# Patient Record
Sex: Female | Born: 1980 | Race: Black or African American | Hispanic: No | Marital: Married | Smoking: Former smoker
Health system: Southern US, Community
[De-identification: ages and names within clinical notes are randomized; demographics above are authoritative.]

## PROBLEM LIST (undated history)

## (undated) DIAGNOSIS — B999 Unspecified infectious disease: Secondary | ICD-10-CM

## (undated) DIAGNOSIS — J45909 Unspecified asthma, uncomplicated: Secondary | ICD-10-CM

## (undated) DIAGNOSIS — D649 Anemia, unspecified: Secondary | ICD-10-CM

## (undated) HISTORY — DX: Anemia, unspecified: D64.9

## (undated) HISTORY — DX: Unspecified infectious disease: B99.9

## (undated) HISTORY — PX: TURBINATE RESECTION: SHX6158

---
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
## 2001-01-11 HISTORY — PX: WISDOM TOOTH EXTRACTION: SHX21

## 2002-11-01 ENCOUNTER — Inpatient Hospital Stay (HOSPITAL_COMMUNITY): Admission: AD | Admit: 2002-11-01 | Discharge: 2002-11-04 | Payer: Self-pay | Admitting: Obstetrics and Gynecology

## 2002-11-08 ENCOUNTER — Inpatient Hospital Stay (HOSPITAL_COMMUNITY): Admission: AD | Admit: 2002-11-08 | Discharge: 2002-11-09 | Payer: Self-pay | Admitting: Obstetrics and Gynecology

## 2002-12-21 ENCOUNTER — Other Ambulatory Visit: Admission: RE | Admit: 2002-12-21 | Discharge: 2002-12-21 | Payer: Self-pay | Admitting: Obstetrics and Gynecology

## 2004-01-01 ENCOUNTER — Other Ambulatory Visit: Admission: RE | Admit: 2004-01-01 | Discharge: 2004-01-01 | Payer: Self-pay | Admitting: Obstetrics and Gynecology

## 2005-02-06 ENCOUNTER — Emergency Department (HOSPITAL_COMMUNITY): Admission: EM | Admit: 2005-02-06 | Discharge: 2005-02-06 | Payer: Self-pay | Admitting: Emergency Medicine

## 2005-11-09 ENCOUNTER — Other Ambulatory Visit: Admission: RE | Admit: 2005-11-09 | Discharge: 2005-11-09 | Payer: Self-pay | Admitting: *Deleted

## 2006-09-24 ENCOUNTER — Inpatient Hospital Stay (HOSPITAL_COMMUNITY): Admission: AD | Admit: 2006-09-24 | Discharge: 2006-09-26 | Payer: Self-pay | Admitting: Obstetrics and Gynecology

## 2010-05-26 NOTE — Discharge Summary (Signed)
NAMEARRIANA, LOHMANN NO.:  [id number]   MEDICAL RECORD NO.:  [id number]          PATIENT TYPE:  INP   LOCATION:  9104                          FACILITY:  [facility]   PHYSICIAN:  Gerrit Friends. Aldona Bar, M.D.   DATE OF BIRTH:  1980/04/01   DATE OF ADMISSION:  09/24/2006  DATE OF DISCHARGE:  09/26/2006                               DISCHARGE SUMMARY   DISCHARGE DIAGNOSES:  1. Term pregnancy, delivered 7 pound female infant, Apgars 9 and 9.  2. Blood type A+.   PROCEDURE:  Normal spontaneous delivery.   SUMMARY:  This 30 year old gravida 2, para 1 was admitted at term in  labor.  She progressed and subsequently had a normal spontaneous  delivery of a viable 7 pound female infant with Apgars of 9 and 9 over  an intact perineum.  Her discharge hemoglobin was 11.6 with a white  count 8500, platelet count of 149,000.  On the morning of September 26, 2006, she was ambulating well, tolerating a regular diet well, having  normal bowel and bladder function and was afebrile.  Her breast-feeding  was going well and she was desirous of discharge.  Accordingly, she was  given all appropriate instructions per discharge brochure and understood  all instructions well.   DISCHARGE MEDICATIONS:  1. Vitamins - one a day.  2. Motrin 600 mg every 6 hours as needed for cramping or pain.   FOLLOW UP:  She will return the office follow-up in approximately four  weeks' time or as needed.   CONDITION ON DISCHARGE:  Improved.      Gerrit Friends. Aldona Bar, M.D.  Electronically Signed     RMW/MEDQ  D:  09/26/2006  T:  09/26/2006  Job:  [id number]

## 2010-10-23 LAB — CBC
HCT: 33.5 — ABNORMAL LOW
HCT: 36.2
Hemoglobin: 11.6 — ABNORMAL LOW
Hemoglobin: 12.6
MCHC: 34.7
MCHC: 34.9
MCV: 97
MCV: 97.2
Platelets: 149 — ABNORMAL LOW
Platelets: 153
RBC: 3.45 — ABNORMAL LOW
RBC: 3.73 — ABNORMAL LOW
RDW: 12.5
RDW: 12.8
WBC: 6.3
WBC: 8.5

## 2010-10-23 LAB — RPR: RPR Ser Ql: NONREACTIVE

## 2011-12-08 ENCOUNTER — Ambulatory Visit (INDEPENDENT_AMBULATORY_CARE_PROVIDER_SITE_OTHER): Payer: 59 | Admitting: Obstetrics and Gynecology

## 2011-12-08 DIAGNOSIS — Z331 Pregnant state, incidental: Secondary | ICD-10-CM

## 2011-12-08 LAB — POCT URINALYSIS DIPSTICK
Bilirubin, UA: NEGATIVE
Blood, UA: NEGATIVE
Glucose, UA: NEGATIVE
Ketones, UA: NEGATIVE
Leukocytes, UA: NEGATIVE
Nitrite, UA: NEGATIVE
Protein, UA: NEGATIVE
Spec Grav, UA: 1.015
Urobilinogen, UA: NEGATIVE
pH, UA: 6

## 2011-12-08 NOTE — Progress Notes (Signed)
NOB interview completed.  Pt is 6w 6d, states this am had some brownish/yellow d/c with wiping x 1.  Has not seen any more today.  Consulted w/ VL.  Pt advised to continue monitoring, if it happens again to call, especially over holiday weekend, pt voices agreement.  NOB work up scheduled for Wednesday 12/30/11 w/ MK.

## 2011-12-10 LAB — PRENATAL PROFILE I(LABCORP)
Antibody Screen: NEGATIVE
Basophils Absolute: 0 10*3/uL (ref 0.0–0.2)
Basos: 1 % (ref 0–3)
Eos: 1 % (ref 0–5)
Eosinophils Absolute: 0.1 10*3/uL (ref 0.0–0.4)
HCT: 34.1 % (ref 34.0–46.6)
Hemoglobin: 11 g/dL — ABNORMAL LOW (ref 11.1–15.9)
Hepatitis B Surface Ag: NEGATIVE
Immature Grans (Abs): 0 10*3/uL (ref 0.0–0.1)
Immature Granulocytes: 0 % (ref 0–2)
Lymphocytes Absolute: 1.8 10*3/uL (ref 0.7–3.1)
Lymphs: 31 % (ref 14–46)
MCH: 30.1 pg (ref 26.6–33.0)
MCHC: 32.3 g/dL (ref 31.5–35.7)
MCV: 93 fL (ref 79–97)
Monocytes Absolute: 0.5 10*3/uL (ref 0.1–0.9)
Monocytes: 8 % (ref 4–12)
Neutrophils Absolute: 3.5 10*3/uL (ref 1.4–7.0)
Neutrophils Relative %: 59 % (ref 40–74)
Platelets: 228 10*3/uL (ref 155–379)
RBC: 3.65 x10E6/uL — ABNORMAL LOW (ref 3.77–5.28)
RDW: 15.1 % (ref 12.3–15.4)
RPR: NONREACTIVE
Rh Factor: POSITIVE
Rubella IgG Quant: 20 IU/mL
WBC: 5.9 10*3/uL (ref 3.4–10.8)

## 2011-12-10 LAB — HEMOGLOBINOPATHY EVALUATION
Free Hemoglobin, Plasma: NEGATIVE
Hgb A2 Quant: 2.5 % (ref 0.7–3.1)
Hgb A: 96.7 % (ref 94.0–98.0)
Hgb C: 0 %
Hgb F Quant: 0.8 % (ref 0.0–2.0)
Hgb S: 0 %

## 2011-12-10 LAB — URINE CULTURE: Organism ID, Bacteria: NO GROWTH

## 2011-12-14 ENCOUNTER — Telehealth: Payer: Self-pay | Admitting: Obstetrics and Gynecology

## 2011-12-15 NOTE — Telephone Encounter (Signed)
Tc to pt regarding msg.  Pt was concerned about her iron level.  Informed was @ 11.0, normal range is 12-15.  Pt advised to take PNV Q day and make sure she is adding iron rich foods to her diet, pt voices agreement.

## 2011-12-26 ENCOUNTER — Encounter: Payer: Self-pay | Admitting: Obstetrics and Gynecology

## 2011-12-30 ENCOUNTER — Ambulatory Visit (INDEPENDENT_AMBULATORY_CARE_PROVIDER_SITE_OTHER): Payer: 59 | Admitting: Obstetrics and Gynecology

## 2011-12-30 VITALS — BP 98/58 | Wt 140.0 lb

## 2011-12-30 DIAGNOSIS — Z331 Pregnant state, incidental: Secondary | ICD-10-CM

## 2011-12-30 NOTE — Progress Notes (Signed)
[redacted]w[redacted]d No complaints. States she does have ha after she eats.

## 2011-12-30 NOTE — Progress Notes (Signed)
[redacted]w[redacted]d

## 2011-12-31 LAB — PAP IG, CT-NG, RFX HPV ASCU
Chlamydia Probe Amp: NEGATIVE
GC Probe Amp: NEGATIVE

## 2012-01-04 NOTE — Progress Notes (Signed)
Patient ID: Priscilla Fox, female   DOB: 1980/01/18, 31 y.o.   MRN: [id number] Late entry from 12/30/2011 Priscilla Fox is a 31 y.o. female presenting for new ob visit. 10w per certain LMP for [facility] 07/27/2012 No N,V  No birth control at time of conception  Taking PNV   OB History    Grav Para Term Preterm Abortions TAB SAB Ect Mult Living   3 2 2       2     hx of SVD 7# x 2 uncomplicated Past Medical History  Diagnosis Date  . Infection     Yeast;not frequent  . Infection     BV;not frequent  . Anemia     Iron supplements taken in the past   Past Surgical History  Procedure Date  . Wisdom tooth extraction 2003    All 4 removed   Family History: family history includes Diabetes in her paternal grandmother; Hypertension in her maternal grandmother; Hypothyroidism in her mother; and Stroke in her maternal grandmother. Social History:  reports that she quit smoking about 6 weeks ago. Her smoking use included Cigarettes. She has never used smokeless tobacco. She reports that she does not use illicit drugs. Her alcohol history not on file.   Blood pressure 98/58, weight 140 lb (63.504 kg), last menstrual period 10/21/2011. Physical exam: Calm, no distress, HEENT wnl lungs clear bilaterally, breasts bilaterally no masses, dimpling, or drainage, AP RRR, abd soft, gravid, nt, bowel sounds active, abdomen nontender,  Normal hair distrubition mons pubis,  Fundal height 12 weeks FHTS 160 EGBUS WNL, sterile speculum exam,  vagina pink, moist normal rugae,  cerix LTC, no cervical motion tenderness, No adnexal masses or tenderness Scant white discharge Bilaterally DTR +2 no clonus No edema to lower extremities  Prenatal labs: ABO, Rh: A/--/-- (11/27 1209) Antibody: Negative (11/27 1209) Rubella:   RPR:    HBsAg: Negative (11/27 1209)  HIV:     Assessment/Plan: 10w GC/CHL sent WET PREP neg PAP sent ULTRASOUND f/o 1st trimester screen in next 7-10 days Collaboration with  Dr.Stringer. [facility], Parris Cudworth 01/04/2012, 6:27 PM Lavera Guise, CNM

## 2012-01-10 ENCOUNTER — Other Ambulatory Visit: Payer: Self-pay | Admitting: Obstetrics and Gynecology

## 2012-01-10 ENCOUNTER — Ambulatory Visit (INDEPENDENT_AMBULATORY_CARE_PROVIDER_SITE_OTHER): Payer: 59

## 2012-01-10 DIAGNOSIS — Z331 Pregnant state, incidental: Secondary | ICD-10-CM

## 2012-01-10 DIAGNOSIS — Z36 Encounter for antenatal screening of mother: Secondary | ICD-10-CM

## 2012-01-26 ENCOUNTER — Telehealth: Payer: Self-pay | Admitting: Obstetrics and Gynecology

## 2012-01-26 ENCOUNTER — Other Ambulatory Visit: Payer: Self-pay

## 2012-01-26 DIAGNOSIS — O2340 Unspecified infection of urinary tract in pregnancy, unspecified trimester: Secondary | ICD-10-CM

## 2012-01-26 NOTE — Telephone Encounter (Signed)
Tc to pt regarding msg.  Pt states has been having a lot of urinary frequency and some lower abdominal pain before she has to use the bathroom and after she uses the bathroom.  She may have to go to the bathroom about 3-4 times in an hour whether she is drinking a lot or not.  Pt has an appt on tomorrow am @ 1030, pt denies any bldg.  Pt advised to push fluids, drink cranberry juice and make mention of sxs tomorrow @ appt so they may possibly need to do a urine culture.

## 2012-01-26 NOTE — Telephone Encounter (Signed)
Per pt request future. order placed for Urine Culture to be sent to [facility].

## 2012-01-27 ENCOUNTER — Encounter: Payer: Self-pay | Admitting: Obstetrics and Gynecology

## 2012-01-27 ENCOUNTER — Ambulatory Visit: Payer: 59 | Admitting: Obstetrics and Gynecology

## 2012-01-27 VITALS — BP 98/52 | Ht 68.0 in | Wt 141.0 lb

## 2012-01-27 DIAGNOSIS — R399 Unspecified symptoms and signs involving the genitourinary system: Secondary | ICD-10-CM

## 2012-01-27 DIAGNOSIS — Z3480 Encounter for supervision of other normal pregnancy, unspecified trimester: Secondary | ICD-10-CM

## 2012-01-27 LAB — POCT URINALYSIS DIPSTICK
Bilirubin, UA: NEGATIVE
Blood, UA: NEGATIVE
Glucose, UA: NEGATIVE
Ketones, UA: NEGATIVE
Protein, UA: NEGATIVE
Spec Grav, UA: <=1.005
Urobilinogen, UA: NEGATIVE
pH, UA: 7

## 2012-01-27 LAB — US OB COMP LESS 14 WKS

## 2012-01-27 MED ORDER — CEPHALEXIN 500 MG PO CAPS: 500.0000 mg | ORAL_CAPSULE | Freq: Two times a day (BID) | ORAL | Status: DC

## 2012-01-27 NOTE — Progress Notes (Signed)
Pt stated she is lower pelvic pain/ UTI ?Marland Kitchen Advised pt to increase 8-10 glasses of water a day. Pt stated no other issues today. Pt wants labs sent to [facility].

## 2012-01-27 NOTE — Progress Notes (Signed)
[redacted]w[redacted]d 1st tri screen neg AFP b/n 16-18wks U/s for anatomy in 4wks Urine Cx - pt would like empiric tx - Rx keflex

## 2012-01-27 NOTE — Addendum Note (Signed)
Addended by: Marla Roe A on: 01/27/2012 03:14 PM   Modules accepted: Orders

## 2012-01-28 LAB — ALPHA FETOPROTEIN, MATERNAL
AFP: 28.3 IU/mL
Curr Gest Age: 15.2 wks.days
MoM for AFP: 0.9
Open Spina bifida: NEGATIVE
Osb Risk: 1:37700 {titer}

## 2012-01-29 LAB — CULTURE, OB URINE
Colony Count: NO GROWTH
Organism ID, Bacteria: NO GROWTH

## 2012-02-04 ENCOUNTER — Other Ambulatory Visit: Payer: 59

## 2012-02-04 DIAGNOSIS — Z3480 Encounter for supervision of other normal pregnancy, unspecified trimester: Secondary | ICD-10-CM

## 2012-02-04 DIAGNOSIS — Z331 Pregnant state, incidental: Secondary | ICD-10-CM

## 2012-02-08 LAB — AFP TUMOR MARKER: AFP-Tumor Marker: 42.9 ng/mL — ABNORMAL HIGH (ref 0.0–8.3)

## 2012-02-10 LAB — SPECIMEN STATUS REPORT

## 2012-02-17 ENCOUNTER — Other Ambulatory Visit: Payer: Self-pay | Admitting: Obstetrics and Gynecology

## 2012-02-17 LAB — MATERNAL SCREEN 4
DIA Mom Value: 0.66
DIA Value (EIA): 131.34 pg/mL
DSR (By Age)    1 IN: 493
DSR (Second Trimester) 1 IN: 2738
Gestational Age: 16.4 WEEKS
MSAFP Mom: 0.89
MSAFP: 34.9 ng/mL
MSHCG Mom: 1.08
MSHCG: 33110 m[IU]/mL
Maternal Age At EDD: 32.4 YEARS
Osb Risk: 10000
T18 (By Age): 1:1921 {titer}
Test Results:: NEGATIVE
Weight: 144 [lb_av]
uE3 Mom: 0.76
uE3 Value: 0.68 ng/mL

## 2012-02-17 LAB — SPECIMEN STATUS REPORT

## 2012-02-24 ENCOUNTER — Encounter: Payer: 59 | Admitting: Obstetrics and Gynecology

## 2012-02-24 ENCOUNTER — Ambulatory Visit: Payer: 59

## 2012-02-28 ENCOUNTER — Other Ambulatory Visit: Payer: Self-pay

## 2012-02-28 DIAGNOSIS — Z36 Encounter for antenatal screening of mother: Secondary | ICD-10-CM

## 2012-02-29 ENCOUNTER — Encounter: Payer: 59 | Admitting: Obstetrics and Gynecology

## 2012-02-29 ENCOUNTER — Other Ambulatory Visit: Payer: 59

## 2012-10-12 ENCOUNTER — Encounter (HOSPITAL_COMMUNITY): Payer: Self-pay | Admitting: *Deleted

## 2013-11-12 ENCOUNTER — Encounter (HOSPITAL_COMMUNITY): Payer: Self-pay | Admitting: *Deleted

## 2017-03-14 DIAGNOSIS — J339 Nasal polyp, unspecified: Secondary | ICD-10-CM | POA: Diagnosis not present

## 2017-04-14 DIAGNOSIS — J331 Polypoid sinus degeneration: Secondary | ICD-10-CM | POA: Diagnosis not present

## 2017-04-14 DIAGNOSIS — Z87891 Personal history of nicotine dependence: Secondary | ICD-10-CM | POA: Diagnosis not present

## 2017-04-14 DIAGNOSIS — K219 Gastro-esophageal reflux disease without esophagitis: Secondary | ICD-10-CM | POA: Diagnosis not present

## 2017-04-14 DIAGNOSIS — Z7289 Other problems related to lifestyle: Secondary | ICD-10-CM | POA: Diagnosis not present

## 2017-07-13 DIAGNOSIS — J3089 Other allergic rhinitis: Secondary | ICD-10-CM | POA: Diagnosis not present

## 2017-07-13 DIAGNOSIS — Z87891 Personal history of nicotine dependence: Secondary | ICD-10-CM | POA: Diagnosis not present

## 2017-07-13 DIAGNOSIS — Z7289 Other problems related to lifestyle: Secondary | ICD-10-CM | POA: Diagnosis not present

## 2017-07-13 DIAGNOSIS — J331 Polypoid sinus degeneration: Secondary | ICD-10-CM | POA: Diagnosis not present

## 2017-10-21 DIAGNOSIS — Z87891 Personal history of nicotine dependence: Secondary | ICD-10-CM | POA: Diagnosis not present

## 2017-10-21 DIAGNOSIS — J3489 Other specified disorders of nose and nasal sinuses: Secondary | ICD-10-CM | POA: Diagnosis not present

## 2017-10-21 DIAGNOSIS — Z7289 Other problems related to lifestyle: Secondary | ICD-10-CM | POA: Diagnosis not present

## 2017-11-10 DIAGNOSIS — J4520 Mild intermittent asthma, uncomplicated: Secondary | ICD-10-CM | POA: Diagnosis not present

## 2017-11-10 DIAGNOSIS — R05 Cough: Secondary | ICD-10-CM | POA: Diagnosis not present

## 2018-06-14 DIAGNOSIS — J343 Hypertrophy of nasal turbinates: Secondary | ICD-10-CM | POA: Diagnosis not present

## 2018-06-14 DIAGNOSIS — R430 Anosmia: Secondary | ICD-10-CM | POA: Diagnosis not present

## 2018-06-14 DIAGNOSIS — J330 Polyp of nasal cavity: Secondary | ICD-10-CM | POA: Diagnosis not present

## 2018-07-12 DIAGNOSIS — J338 Other polyp of sinus: Secondary | ICD-10-CM | POA: Diagnosis not present

## 2018-07-12 DIAGNOSIS — J343 Hypertrophy of nasal turbinates: Secondary | ICD-10-CM | POA: Diagnosis not present

## 2018-07-12 DIAGNOSIS — J310 Chronic rhinitis: Secondary | ICD-10-CM | POA: Diagnosis not present

## 2018-07-28 ENCOUNTER — Other Ambulatory Visit: Payer: Self-pay | Admitting: Otolaryngology

## 2018-07-28 DIAGNOSIS — J329 Chronic sinusitis, unspecified: Secondary | ICD-10-CM

## 2018-08-03 ENCOUNTER — Ambulatory Visit
Admission: RE | Admit: 2018-08-03 | Discharge: 2018-08-03 | Disposition: A | Discharge status: Home / Self Care | Payer: Self-pay | Source: Ambulatory Visit | Attending: Otolaryngology | Admitting: Otolaryngology

## 2018-08-03 DIAGNOSIS — J320 Chronic maxillary sinusitis: Secondary | ICD-10-CM | POA: Diagnosis not present

## 2018-08-03 DIAGNOSIS — J329 Chronic sinusitis, unspecified: Secondary | ICD-10-CM

## 2018-08-16 DIAGNOSIS — J320 Chronic maxillary sinusitis: Secondary | ICD-10-CM | POA: Diagnosis not present

## 2018-08-16 DIAGNOSIS — J338 Other polyp of sinus: Secondary | ICD-10-CM | POA: Diagnosis not present

## 2018-08-16 DIAGNOSIS — J322 Chronic ethmoidal sinusitis: Secondary | ICD-10-CM | POA: Diagnosis not present

## 2018-08-16 DIAGNOSIS — J321 Chronic frontal sinusitis: Secondary | ICD-10-CM | POA: Diagnosis not present

## 2018-09-04 ENCOUNTER — Other Ambulatory Visit: Payer: Self-pay | Admitting: Otolaryngology

## 2018-09-11 DIAGNOSIS — J300 Vasomotor rhinitis: Secondary | ICD-10-CM | POA: Diagnosis not present

## 2018-09-11 DIAGNOSIS — J309 Allergic rhinitis, unspecified: Secondary | ICD-10-CM | POA: Diagnosis not present

## 2018-09-11 DIAGNOSIS — R05 Cough: Secondary | ICD-10-CM | POA: Diagnosis not present

## 2018-09-11 DIAGNOSIS — J339 Nasal polyp, unspecified: Secondary | ICD-10-CM | POA: Diagnosis not present

## 2018-09-29 ENCOUNTER — Encounter (HOSPITAL_BASED_OUTPATIENT_CLINIC_OR_DEPARTMENT_OTHER): Payer: Self-pay | Admitting: *Deleted

## 2018-09-29 ENCOUNTER — Other Ambulatory Visit: Payer: Self-pay

## 2018-10-03 ENCOUNTER — Other Ambulatory Visit (HOSPITAL_COMMUNITY)
Admission: RE | Admit: 2018-10-03 | Discharge: 2018-10-03 | Disposition: A | Discharge status: Home / Self Care | Payer: BC Managed Care – PPO | Source: Ambulatory Visit | Attending: Otolaryngology | Admitting: Otolaryngology

## 2018-10-03 DIAGNOSIS — Z20828 Contact with and (suspected) exposure to other viral communicable diseases: Secondary | ICD-10-CM | POA: Insufficient documentation

## 2018-10-03 DIAGNOSIS — Z01812 Encounter for preprocedural laboratory examination: Secondary | ICD-10-CM | POA: Diagnosis not present

## 2018-10-04 LAB — NOVEL CORONAVIRUS, NAA (HOSP ORDER, SEND-OUT TO REF LAB; TAT 18-24 HRS): SARS-CoV-2, NAA: NOT DETECTED

## 2018-10-06 ENCOUNTER — Ambulatory Visit (HOSPITAL_BASED_OUTPATIENT_CLINIC_OR_DEPARTMENT_OTHER): Payer: BC Managed Care – PPO | Admitting: Anesthesiology

## 2018-10-06 ENCOUNTER — Ambulatory Visit (HOSPITAL_BASED_OUTPATIENT_CLINIC_OR_DEPARTMENT_OTHER)
Admission: RE | Admit: 2018-10-06 | Discharge: 2018-10-06 | Disposition: A | Discharge status: Home / Self Care | Payer: BC Managed Care – PPO | Attending: Otolaryngology | Admitting: Otolaryngology

## 2018-10-06 ENCOUNTER — Encounter (HOSPITAL_BASED_OUTPATIENT_CLINIC_OR_DEPARTMENT_OTHER): Payer: Self-pay | Admitting: *Deleted

## 2018-10-06 ENCOUNTER — Encounter (HOSPITAL_BASED_OUTPATIENT_CLINIC_OR_DEPARTMENT_OTHER)
Admission: RE | Disposition: A | Discharge status: Home / Self Care | Payer: Self-pay | Source: Home / Self Care | Attending: Otolaryngology

## 2018-10-06 ENCOUNTER — Other Ambulatory Visit: Payer: Self-pay

## 2018-10-06 DIAGNOSIS — J343 Hypertrophy of nasal turbinates: Secondary | ICD-10-CM | POA: Diagnosis not present

## 2018-10-06 DIAGNOSIS — J3489 Other specified disorders of nose and nasal sinuses: Secondary | ICD-10-CM | POA: Insufficient documentation

## 2018-10-06 DIAGNOSIS — Z87891 Personal history of nicotine dependence: Secondary | ICD-10-CM | POA: Insufficient documentation

## 2018-10-06 DIAGNOSIS — J45909 Unspecified asthma, uncomplicated: Secondary | ICD-10-CM | POA: Diagnosis not present

## 2018-10-06 DIAGNOSIS — J330 Polyp of nasal cavity: Secondary | ICD-10-CM | POA: Diagnosis not present

## 2018-10-06 DIAGNOSIS — J320 Chronic maxillary sinusitis: Secondary | ICD-10-CM | POA: Diagnosis not present

## 2018-10-06 DIAGNOSIS — J323 Chronic sphenoidal sinusitis: Secondary | ICD-10-CM | POA: Diagnosis not present

## 2018-10-06 DIAGNOSIS — J329 Chronic sinusitis, unspecified: Secondary | ICD-10-CM | POA: Diagnosis not present

## 2018-10-06 DIAGNOSIS — J338 Other polyp of sinus: Secondary | ICD-10-CM | POA: Insufficient documentation

## 2018-10-06 DIAGNOSIS — J322 Chronic ethmoidal sinusitis: Secondary | ICD-10-CM | POA: Diagnosis not present

## 2018-10-06 DIAGNOSIS — J321 Chronic frontal sinusitis: Secondary | ICD-10-CM | POA: Diagnosis not present

## 2018-10-06 DIAGNOSIS — J324 Chronic pansinusitis: Secondary | ICD-10-CM | POA: Insufficient documentation

## 2018-10-06 HISTORY — PX: MAXILLARY ANTROSTOMY: SHX2003

## 2018-10-06 HISTORY — PX: FRONTAL SINUS EXPLORATION: SHX6591

## 2018-10-06 HISTORY — PX: SPHENOIDECTOMY: SHX2421

## 2018-10-06 HISTORY — PX: SINUS ENDO WITH FUSION: SHX5329

## 2018-10-06 HISTORY — DX: Unspecified asthma, uncomplicated: J45.909

## 2018-10-06 HISTORY — PX: ETHMOIDECTOMY: SHX5197

## 2018-10-06 HISTORY — PX: TURBINATE REDUCTION: SHX6157

## 2018-10-06 LAB — POCT PREGNANCY, URINE: Preg Test, Ur: NEGATIVE

## 2018-10-06 SURGERY — REDUCTION, NASAL TURBINATE
Anesthesia: General | Site: Nose | Laterality: Bilateral

## 2018-10-06 MED ORDER — OXYMETAZOLINE HCL 0.05 % NA SOLN
NASAL | Status: DC | PRN
  Administered 2018-10-06: 1 via TOPICAL

## 2018-10-06 MED ORDER — LIDOCAINE 2% (20 MG/ML) 5 ML SYRINGE
INTRAMUSCULAR | Status: AC
  Filled 2018-10-06: qty 5

## 2018-10-06 MED ORDER — FENTANYL CITRATE (PF) 100 MCG/2ML IJ SOLN
INTRAMUSCULAR | Status: AC
  Filled 2018-10-06: qty 2

## 2018-10-06 MED ORDER — PROMETHAZINE HCL 25 MG/ML IJ SOLN
6.2500 mg | INTRAMUSCULAR | Status: DC | PRN
  Administered 2018-10-06: 6.25 mg via INTRAVENOUS

## 2018-10-06 MED ORDER — KETOROLAC TROMETHAMINE 30 MG/ML IJ SOLN: 30.0000 mg | Freq: Once | INTRAMUSCULAR | Status: DC | PRN

## 2018-10-06 MED ORDER — OXYMETAZOLINE HCL 0.05 % NA SOLN
NASAL | Status: AC
  Filled 2018-10-06: qty 30

## 2018-10-06 MED ORDER — ACETAMINOPHEN 500 MG PO TABS
ORAL_TABLET | ORAL | Status: AC
  Filled 2018-10-06: qty 2

## 2018-10-06 MED ORDER — BUPIVACAINE HCL (PF) 0.25 % IJ SOLN
INTRAMUSCULAR | Status: AC
  Filled 2018-10-06: qty 30

## 2018-10-06 MED ORDER — PROMETHAZINE HCL 25 MG/ML IJ SOLN
INTRAMUSCULAR | Status: AC
  Filled 2018-10-06: qty 1

## 2018-10-06 MED ORDER — OXYCODONE HCL 5 MG PO TABS: 5.0000 mg | ORAL_TABLET | Freq: Once | ORAL | Status: DC | PRN

## 2018-10-06 MED ORDER — OXYCODONE-ACETAMINOPHEN 5-325 MG PO TABS: 1.0000 | ORAL_TABLET | ORAL | 0 refills | Status: AC | PRN

## 2018-10-06 MED ORDER — CEFAZOLIN SODIUM 1 G IJ SOLR
INTRAMUSCULAR | Status: AC
  Filled 2018-10-06: qty 20

## 2018-10-06 MED ORDER — ONDANSETRON HCL 4 MG/2ML IJ SOLN
INTRAMUSCULAR | Status: AC
  Filled 2018-10-06: qty 2

## 2018-10-06 MED ORDER — MUPIROCIN 2 % EX OINT
TOPICAL_OINTMENT | CUTANEOUS | Status: AC
  Filled 2018-10-06: qty 22

## 2018-10-06 MED ORDER — ROCURONIUM BROMIDE 10 MG/ML (PF) SYRINGE
PREFILLED_SYRINGE | INTRAVENOUS | Status: DC | PRN
  Administered 2018-10-06: 50 mg via INTRAVENOUS

## 2018-10-06 MED ORDER — MUPIROCIN 2 % EX OINT
TOPICAL_OINTMENT | CUTANEOUS | Status: DC | PRN
  Administered 2018-10-06: 1 via NASAL

## 2018-10-06 MED ORDER — BACITRACIN ZINC 500 UNIT/GM EX OINT
TOPICAL_OINTMENT | CUTANEOUS | Status: AC
  Filled 2018-10-06: qty 0.9

## 2018-10-06 MED ORDER — ACETAMINOPHEN 500 MG PO TABS
1000.0000 mg | ORAL_TABLET | Freq: Once | ORAL | Status: AC
  Administered 2018-10-06: 08:00:00 1000 mg via ORAL

## 2018-10-06 MED ORDER — FENTANYL CITRATE (PF) 100 MCG/2ML IJ SOLN: 50.0000 ug | INTRAMUSCULAR | Status: DC | PRN

## 2018-10-06 MED ORDER — SCOPOLAMINE 1 MG/3DAYS TD PT72
MEDICATED_PATCH | TRANSDERMAL | Status: AC
  Filled 2018-10-06: qty 1

## 2018-10-06 MED ORDER — AMOXICILLIN 875 MG PO TABS: 875.0000 mg | ORAL_TABLET | Freq: Two times a day (BID) | ORAL | 0 refills | Status: AC

## 2018-10-06 MED ORDER — MEPERIDINE HCL 25 MG/ML IJ SOLN: 6.2500 mg | INTRAMUSCULAR | Status: DC | PRN

## 2018-10-06 MED ORDER — ONDANSETRON HCL 4 MG/2ML IJ SOLN
INTRAMUSCULAR | Status: DC | PRN
  Administered 2018-10-06: 4 mg via INTRAVENOUS

## 2018-10-06 MED ORDER — OXYCODONE HCL 5 MG/5ML PO SOLN: 5.0000 mg | Freq: Once | ORAL | Status: DC | PRN

## 2018-10-06 MED ORDER — DEXAMETHASONE SODIUM PHOSPHATE 10 MG/ML IJ SOLN
INTRAMUSCULAR | Status: DC | PRN
  Administered 2018-10-06: 10 mg via INTRAVENOUS
  Administered 2018-10-06: 5 mg via INTRAVENOUS

## 2018-10-06 MED ORDER — ROCURONIUM BROMIDE 10 MG/ML (PF) SYRINGE
PREFILLED_SYRINGE | INTRAVENOUS | Status: AC
  Filled 2018-10-06: qty 10

## 2018-10-06 MED ORDER — LIDOCAINE 2% (20 MG/ML) 5 ML SYRINGE
INTRAMUSCULAR | Status: DC | PRN
  Administered 2018-10-06: 80 mg via INTRAVENOUS

## 2018-10-06 MED ORDER — MIDAZOLAM HCL 2 MG/2ML IJ SOLN: 1.0000 mg | INTRAMUSCULAR | Status: DC | PRN

## 2018-10-06 MED ORDER — MIDAZOLAM HCL 2 MG/2ML IJ SOLN
INTRAMUSCULAR | Status: AC
  Filled 2018-10-06: qty 2

## 2018-10-06 MED ORDER — LIDOCAINE-EPINEPHRINE 1 %-1:100000 IJ SOLN
INTRAMUSCULAR | Status: AC
  Filled 2018-10-06: qty 1

## 2018-10-06 MED ORDER — FENTANYL CITRATE (PF) 100 MCG/2ML IJ SOLN
25.0000 ug | INTRAMUSCULAR | Status: DC | PRN
  Administered 2018-10-06 (×2): 25 ug via INTRAVENOUS

## 2018-10-06 MED ORDER — SUGAMMADEX SODIUM 200 MG/2ML IV SOLN
INTRAVENOUS | Status: DC | PRN
  Administered 2018-10-06: 160 mg via INTRAVENOUS

## 2018-10-06 MED ORDER — PROPOFOL 10 MG/ML IV BOLUS
INTRAVENOUS | Status: DC | PRN
  Administered 2018-10-06: 200 mg via INTRAVENOUS

## 2018-10-06 MED ORDER — SCOPOLAMINE 1 MG/3DAYS TD PT72
1.0000 | MEDICATED_PATCH | TRANSDERMAL | Status: DC
  Administered 2018-10-06: 08:00:00 1.5 mg via TRANSDERMAL

## 2018-10-06 MED ORDER — CEFAZOLIN SODIUM-DEXTROSE 2-3 GM-%(50ML) IV SOLR
INTRAVENOUS | Status: DC | PRN
  Administered 2018-10-06: 2 g via INTRAVENOUS

## 2018-10-06 MED ORDER — MIDAZOLAM HCL 2 MG/2ML IJ SOLN
INTRAMUSCULAR | Status: DC | PRN
  Administered 2018-10-06: 2 mg via INTRAVENOUS

## 2018-10-06 MED ORDER — LACTATED RINGERS IV SOLN
INTRAVENOUS | Status: DC
  Administered 2018-10-06 (×3): via INTRAVENOUS

## 2018-10-06 MED ORDER — FENTANYL CITRATE (PF) 100 MCG/2ML IJ SOLN
INTRAMUSCULAR | Status: DC | PRN
  Administered 2018-10-06 (×3): 100 ug via INTRAVENOUS

## 2018-10-06 MED ORDER — DEXAMETHASONE SODIUM PHOSPHATE 10 MG/ML IJ SOLN
INTRAMUSCULAR | Status: AC
  Filled 2018-10-06: qty 1

## 2018-10-06 SURGICAL SUPPLY — 59 items
ATTRACTOMAT 16X20 MAGNETIC DRP (DRAPES) IMPLANT
BLADE RAD40 ROTATE 4M 4 5PK (BLADE) IMPLANT
BLADE RAD40 ROTATE 4M 4MM 5PK (BLADE)
BLADE RAD60 ROTATE M4 4 5PK (BLADE) IMPLANT
BLADE RAD60 ROTATE M4 4MM 5PK (BLADE)
BLADE ROTATE RAD 12 4 M4 (BLADE) IMPLANT
BLADE ROTATE RAD 12 4MM M4 (BLADE)
BLADE ROTATE RAD 40 4 M4 (BLADE) IMPLANT
BLADE ROTATE RAD 40 4MM M4 (BLADE)
BLADE ROTATE TRICUT 4MX13CM M4 (BLADE) ×1
BLADE ROTATE TRICUT 4X13 M4 (BLADE) ×2 IMPLANT
BLADE TRICUT ROTATE M4 4 5PK (BLADE) IMPLANT
BLADE TRICUT ROTATE M4 4MM 5PK (BLADE)
BUR HS RAD FRONTAL 3 (BURR) IMPLANT
BUR HS RAD FRONTAL 3MM (BURR)
CANISTER SUC SOCK COL 7IN (MISCELLANEOUS) ×3 IMPLANT
CANISTER SUCT 1200ML W/VALVE (MISCELLANEOUS) ×3 IMPLANT
COAGULATOR SUCT 8FR VV (MISCELLANEOUS) ×3 IMPLANT
COVER WAND RF STERILE (DRAPES) IMPLANT
DECANTER SPIKE VIAL GLASS SM (MISCELLANEOUS) IMPLANT
DRSG NASAL KENNEDY LMNT 8CM (GAUZE/BANDAGES/DRESSINGS) IMPLANT
DRSG NASOPORE 8CM (GAUZE/BANDAGES/DRESSINGS) IMPLANT
DRSG TELFA 3X8 NADH (GAUZE/BANDAGES/DRESSINGS) IMPLANT
ELECT REM PT RETURN 9FT ADLT (ELECTROSURGICAL) ×3
ELECTRODE REM PT RTRN 9FT ADLT (ELECTROSURGICAL) ×1 IMPLANT
GLOVE BIO SURGEON STRL SZ7.5 (GLOVE) ×3 IMPLANT
GLOVE BIOGEL PI IND STRL 6.5 (GLOVE) IMPLANT
GLOVE BIOGEL PI INDICATOR 6.5 (GLOVE) ×2
GLOVE ECLIPSE 6.5 STRL STRAW (GLOVE) ×4 IMPLANT
GOWN STRL REUS W/ TWL LRG LVL3 (GOWN DISPOSABLE) ×2 IMPLANT
GOWN STRL REUS W/TWL LRG LVL3 (GOWN DISPOSABLE) ×2
HEMOSTAT SURGICEL 2X14 (HEMOSTASIS) IMPLANT
IV NS 500ML (IV SOLUTION) ×2
IV NS 500ML BAXH (IV SOLUTION) ×1 IMPLANT
NDL HYPO 25X1 1.5 SAFETY (NEEDLE) ×1 IMPLANT
NDL SPNL 25GX3.5 QUINCKE BL (NEEDLE) IMPLANT
NEEDLE HYPO 25X1 1.5 SAFETY (NEEDLE) ×3 IMPLANT
NEEDLE SPNL 25GX3.5 QUINCKE BL (NEEDLE) IMPLANT
NS IRRIG 1000ML POUR BTL (IV SOLUTION) ×3 IMPLANT
PACK BASIN DAY SURGERY FS (CUSTOM PROCEDURE TRAY) ×3 IMPLANT
PACK ENT DAY SURGERY (CUSTOM PROCEDURE TRAY) ×3 IMPLANT
PAD DRESSING TELFA 3X8 NADH (GAUZE/BANDAGES/DRESSINGS) IMPLANT
SLEEVE SCD COMPRESS KNEE MED (MISCELLANEOUS) ×3 IMPLANT
SOLUTION BUTLER CLEAR DIP (MISCELLANEOUS) ×3 IMPLANT
SPLINT NASAL AIRWAY SILICONE (MISCELLANEOUS) IMPLANT
SPONGE GAUZE 2X2 8PLY STER LF (GAUZE/BANDAGES/DRESSINGS) ×1
SPONGE GAUZE 2X2 8PLY STRL LF (GAUZE/BANDAGES/DRESSINGS) ×2 IMPLANT
SPONGE NEURO XRAY DETECT 1X3 (DISPOSABLE) ×3 IMPLANT
SUCTION FRAZIER HANDLE 10FR (MISCELLANEOUS)
SUCTION TUBE FRAZIER 10FR DISP (MISCELLANEOUS) IMPLANT
SYR 50ML LL SCALE MARK (SYRINGE) ×3 IMPLANT
TOWEL GREEN STERILE FF (TOWEL DISPOSABLE) ×3 IMPLANT
TRACKER ENT INSTRUMENT (MISCELLANEOUS) ×3 IMPLANT
TRACKER ENT PATIENT (MISCELLANEOUS) ×3 IMPLANT
TUBE CONNECTING 20'X1/4 (TUBING) ×1
TUBE CONNECTING 20X1/4 (TUBING) ×2 IMPLANT
TUBE SALEM SUMP 16 FR W/ARV (TUBING) ×2 IMPLANT
TUBING STRAIGHTSHOT EPS 5PK (TUBING) ×3 IMPLANT
YANKAUER SUCT BULB TIP NO VENT (SUCTIONS) ×3 IMPLANT

## 2018-10-06 NOTE — Anesthesia Procedure Notes (Signed)
Procedure Name: Intubation Date/Time: 10/06/2018 9:05 AM Performed by: Raenette Rover, CRNA Pre-anesthesia Checklist: Patient identified, Emergency Drugs available, Suction available and Patient being monitored Patient Re-evaluated:Patient Re-evaluated prior to induction Oxygen Delivery Method: Circle system utilized Preoxygenation: Pre-oxygenation with 100% oxygen Induction Type: IV induction Ventilation: Mask ventilation without difficulty Laryngoscope Size: Mac and 3 Grade View: Grade I Tube type: Oral Tube size: 7.0 mm Number of attempts: 1 Airway Equipment and Method: Stylet Placement Confirmation: ETT inserted through vocal cords under direct vision,  positive ETCO2 and breath sounds checked- equal and bilateral Secured at: 22 cm Tube secured with: Tape Dental Injury: Teeth and Oropharynx as per pre-operative assessment

## 2018-10-06 NOTE — Transfer of Care (Signed)
Immediate Anesthesia Transfer of Care Note  Patient: Priscilla Fox  Procedure(s) Performed: PARTIAL INFERIOR TURBINATE REDUCTION (Bilateral Nose) ENDOSCOPIC MAXILLARY ANTROSTOMY WITH TISSUE REMOVAL (Bilateral Nose) ENDOSCOPIC FRONTAL RECESS EXPLORATION (Bilateral Nose) TOTAL ETHMOIDECTOMY WITH TISSUE REMOVAL (Bilateral Nose) TOTAL SPHENOIDECTOMY WITH TISSUE REMOVAL (Bilateral Nose) SINUS ENDO WITH FUSION (Bilateral Nose)  Patient Location: PACU  Anesthesia Type:General  Level of Consciousness: awake, alert , oriented and patient cooperative  Airway & Oxygen Therapy: Patient Spontanous Breathing  Post-op Assessment: Report given to RN and Post -op Vital signs reviewed and stable  Post vital signs: Reviewed and stable  Last Vitals:  Vitals Value Taken Time  BP 126/80 10/06/18 1100  Temp    Pulse 101 10/06/18 1103  Resp 12 10/06/18 1103  SpO2 97 % 10/06/18 1103  Vitals shown include unvalidated device data.  Last Pain:  Vitals:   10/06/18 0753  TempSrc: Oral      Patients Stated Pain Goal: 3 (54/27/06 2376)  Complications: No apparent anesthesia complications

## 2018-10-06 NOTE — Anesthesia Postprocedure Evaluation (Signed)
Anesthesia Post Note  Patient: Priscilla Fox  Procedure(s) Performed: PARTIAL INFERIOR TURBINATE REDUCTION (Bilateral Nose) ENDOSCOPIC MAXILLARY ANTROSTOMY WITH TISSUE REMOVAL (Bilateral Nose) ENDOSCOPIC FRONTAL RECESS EXPLORATION (Bilateral Nose) TOTAL ETHMOIDECTOMY WITH TISSUE REMOVAL (Bilateral Nose) TOTAL SPHENOIDECTOMY WITH TISSUE REMOVAL (Bilateral Nose) SINUS ENDO WITH FUSION (Bilateral Nose)     Patient location during evaluation: PACU Anesthesia Type: General Level of consciousness: awake and alert Pain management: pain level controlled Vital Signs Assessment: post-procedure vital signs reviewed and stable Respiratory status: spontaneous breathing, nonlabored ventilation, respiratory function stable and patient connected to nasal cannula oxygen Cardiovascular status: blood pressure returned to baseline and stable Postop Assessment: no apparent nausea or vomiting Anesthetic complications: no    Last Vitals:  Vitals:   10/06/18 1100 10/06/18 1115  BP: 126/80 130/80  Pulse: (!) 102 93  Resp: 11 10  Temp: 36.8 C   SpO2: 97% 97%    Last Pain:  Vitals:   10/06/18 1115  TempSrc:   PainSc: [place]

## 2018-10-06 NOTE — Addendum Note (Signed)
Addendum  created 10/06/18 1146 by Pervis Hocking, DO   Order list changed

## 2018-10-06 NOTE — Op Note (Signed)
DATE OF PROCEDURE: 10/06/2018  OPERATIVE REPORT   SURGEON: Leta Baptist, MD   PREOPERATIVE DIAGNOSES:  1. Bilateral chronic pansinusitis and polyposis. 2. Bilateral inferior turbinate hypertrophy.  3. Chronic nasal obstruction.  POSTOPERATIVE DIAGNOSES:  1. Bilateral chronic pansinusitis and polyposis. 2. Bilateral inferior turbinate hypertrophy.  3. Chronic nasal obstruction.  PROCEDURE PERFORMED:  1. Bilateral endoscopic frontal sinusotomy with polyp removal. 2. Bilateral endoscopic total ethmoidectomy and sphenoidotomy with polyp removal. 3. Bilateral endoscopic maxillary antrostomy with polyp removal. 4. Bilateral partial inferior turbinate resection. 5. FUSION stereotactic image guidance.  ANESTHESIA: General endotracheal tube anesthesia.   COMPLICATIONS: None.   ESTIMATED BLOOD LOSS: 500 mL.   INDICATION FOR PROCEDURE: Priscilla Fox is a 38 y.o. female with a history of chronic nasal obstruction and sinonasal polyps.  At her last appointment, she was noted to have chronic rhinosinusitis, nasal mucosal congestion, and bilateral severe inferior turbinate hypertrophy.  She was also noted to have polypoid tissue obstructing her sinonasal cavities.  The patient previously underwent endoscopic sinus surgery by Dr. Laurance Flatten in 2018.  The patient has been using Flonase nasal spray. She was also treated with multiple high dose Prednisone. However, she continued to have difficulty breathing through her nose.  She was also complaining of persistent facial pressure and discomfort. Based on the above findings, the decision was made for the patient to undergo the above-stated procedures. The risks, benefits, alternatives, and details of the procedures were discussed with the patient. Questions were invited and answered. Informed consent was obtained.   DESCRIPTION OF PROCEDURE: The patient was taken to the operating room and placed supine on the operating table. General endotracheal tube anesthesia was  administered by the anesthesiologist. The patient was positioned, and prepped and draped in the standard fashion for nasal surgery. Pledgets soaked with Afrin were placed in both nasal cavities for decongestion. The pledgets were subsequently removed. The FUSION stereotactic image guidance marker was placed. The image guidance system was functional throughout the case.  The inferior one half of both hypertrophied inferior turbinate was crossclamped with a Kelly clamp. The inferior one half of each inferior turbinate was then resected with a pair of cross cutting scissors. Hemostasis was achieved with a suction cautery device.   Using a 0 endoscope, the left nasal cavity was examined.  Polypoid tissue was noted within the middle meatus. The polypoid tissue was removed using a combination of microdebrider and Blakesley forceps. The maxillary antrum was entered and enlarged using a combination of backbiter and microdebrider. Polypoid tissue was removed from the left maxillary sinus.  Attention was then focused on the ethmoid sinuses. The bony partitions of the anterior and posterior ethmoid cavities were taken down. Polypoid tissue was noted and removed.  More polyps were noted to obstruct the left sphenoid opening. The polyps were removed. The sphenoid opening was entered and enlarged. More polyps were removed from the sphenoid sinus. Attention was then focused on the frontal sinus. The frontal recess was identified and enlarged by removing the surrounding bony partitions. Polypoid tissue was removed from the frontal recess. All 4 paranasal sinuses were copiously irrigated with saline solution.  The same procedure was repeated on the right side without exception. More polyps were noted on the right side. All polyps were removed.   The care of the patient was turned over to the anesthesiologist. The patient was awakened from anesthesia without difficulty. The patient was extubated and transferred to the  recovery room in good condition.   OPERATIVE FINDINGS: Bilateral chronic  pansinusitis and polyposis, bilateral inferior turbinate hypertrophy.   SPECIMEN: Bilateral sinus contents.  FOLLOWUP CARE: The patient be discharged home once she is awake and alert. The patient will be placed on Percocet 1 tablets p.o. q.4 hours p.r.n. pain, and amoxicillin 875 mg p.o. b.i.d. for 5 days. The patient will follow up in my office in approximately 1 week.  Alban Marucci Philomena Doheny, MD

## 2018-10-06 NOTE — Discharge Instructions (Addendum)

## 2018-10-06 NOTE — H&P (Signed)
Cc: Chronic nasal obstruction, sinonasal polyps  HPI: The patient is a 38 year old female who presents today for evaluation of her chronic nasal obstruction and sinonasal polyps.  The patient was last seen 1 month ago.  At that time, she was noted to have chronic rhinosinusitis, nasal mucosal congestion, and bilateral severe inferior turbinate hypertrophy.  She was also noted to have polypoid tissue obstructing her sinonasal cavities.  The patient previously underwent endoscopic sinus surgery by Dr. Laurance Flatten in 2018.  The patient has been using Flonase nasal spray.  At her last visit, she was also placed on high dose Prednisone for 12 days.  According to the patient, her nasal breathing improved slightly with the use of Prednisone. However, she continues to have difficulty breathing through her nose.  She is also complaining of persistent facial pressure and discomfort.   Exam: The nasal cavities were decongested and anesthetised with a combination of oxymetazoline and 4% lidocaine solution.  The flexible scope was inserted into the right nasal cavity.  Endoscopy of the inferior and middle meatus was performed.  Edematous mucosa was noted.  Polypoid tissue noted to obstruct the middle meatus.   Nasopharynx was clear.  Turbinates were hypertrophied but without mass.  Incomplete response to decongestion.  The procedure was repeated on the contralateral side with similar findings.  The patient tolerated the procedure well.  Instructions were given to avoid eating or drinking for 2 hours.    Assessment:  1.  Bilateral chronic pansinusitis with sinonasal polyps, filling all her paranasal sinuses.  Her CT scan shows opacification of her maxillary, ethmoid, frontal and sphenoid sinuses bilaterally.   2.  Bilateral severe inferior turbinate hypertrophy, causing greater than 95% obstruction of her nasal passageways bilaterally.    Plan: 1.  The nasal endoscopy findings and the CT images are reviewed with the  patient.  2.  The patient should continue with her Flonase nasal spray.  The patient has recently been treated with multiple courses of high dose systemic steroid.   3.  The patient may benefit from an allergy evaluation.  A referral to an allergist will be made as soon as possible.  4.  Based on her persistent symptoms, she will also benefit from undergoing revision sinus surgery and reduction of her inferior turbinates.  The risks, benefits, alternatives and details of the procedures are reviewed with the patient.  Questions are invited and answered.  5.  The patient would like to proceed with the procedures.

## 2018-10-06 NOTE — Anesthesia Preprocedure Evaluation (Addendum)
Anesthesia Evaluation  Patient identified by MRN, date of birth, ID band Patient awake    Reviewed: Allergy & Precautions, NPO status , Patient's Chart, lab work & pertinent test results  Airway Mallampati: I  TM Distance: >3 FB Neck ROM: Full    Dental no notable dental hx. (+) Teeth Intact, Dental Advisory Given   Pulmonary asthma , former smoker,  Quit smoking 2013   Pulmonary exam normal breath sounds clear to auscultation       Cardiovascular negative cardio ROS Normal cardiovascular exam Rhythm:Regular Rate:Normal     Neuro/Psych negative neurological ROS  negative psych ROS   GI/Hepatic negative GI ROS, Neg liver ROS,   Endo/Other  negative endocrine ROS  Renal/GU negative Renal ROS  negative genitourinary   Musculoskeletal negative musculoskeletal ROS (+)   Abdominal Normal abdominal exam  (+)   Peds negative pediatric ROS (+)  Hematology negative hematology ROS (+) anemia ,   Anesthesia Other Findings   Reproductive/Obstetrics negative OB ROS                            Anesthesia Physical Anesthesia Plan  ASA: I  Anesthesia Plan: General   Post-op Pain Management:    Induction: Intravenous  PONV Risk Score and Plan: 4 or greater and Ondansetron, Dexamethasone, Midazolam, Scopolamine patch - Pre-op and Treatment may vary due to age or medical condition  Airway Management Planned: Oral ETT  Additional Equipment: None  Intra-op Plan:   Post-operative Plan: Extubation in OR  Informed Consent: I have reviewed the patients History and Physical, chart, labs and discussed the procedure including the risks, benefits and alternatives for the proposed anesthesia with the patient or authorized representative who has indicated his/her understanding and acceptance.     Dental advisory given  Plan Discussed with: CRNA  Anesthesia Plan Comments:         Anesthesia  Quick Evaluation

## 2018-10-09 ENCOUNTER — Encounter (HOSPITAL_BASED_OUTPATIENT_CLINIC_OR_DEPARTMENT_OTHER): Payer: Self-pay | Admitting: Otolaryngology

## 2018-10-09 LAB — SURGICAL PATHOLOGY

## 2018-10-12 ENCOUNTER — Other Ambulatory Visit: Payer: Self-pay

## 2018-10-12 ENCOUNTER — Ambulatory Visit (INDEPENDENT_AMBULATORY_CARE_PROVIDER_SITE_OTHER): Payer: BC Managed Care – PPO | Admitting: Otolaryngology

## 2018-10-12 DIAGNOSIS — J322 Chronic ethmoidal sinusitis: Secondary | ICD-10-CM | POA: Diagnosis not present

## 2018-10-12 DIAGNOSIS — J321 Chronic frontal sinusitis: Secondary | ICD-10-CM

## 2018-10-12 DIAGNOSIS — J320 Chronic maxillary sinusitis: Secondary | ICD-10-CM

## 2018-10-12 DIAGNOSIS — J338 Other polyp of sinus: Secondary | ICD-10-CM | POA: Diagnosis not present

## 2018-10-26 ENCOUNTER — Ambulatory Visit (INDEPENDENT_AMBULATORY_CARE_PROVIDER_SITE_OTHER): Payer: BC Managed Care – PPO | Admitting: Otolaryngology

## 2018-10-26 ENCOUNTER — Other Ambulatory Visit: Payer: Self-pay

## 2018-10-26 DIAGNOSIS — J322 Chronic ethmoidal sinusitis: Secondary | ICD-10-CM | POA: Diagnosis not present

## 2018-10-26 DIAGNOSIS — J321 Chronic frontal sinusitis: Secondary | ICD-10-CM

## 2018-10-26 DIAGNOSIS — J338 Other polyp of sinus: Secondary | ICD-10-CM

## 2018-10-26 DIAGNOSIS — J320 Chronic maxillary sinusitis: Secondary | ICD-10-CM

## 2018-11-16 ENCOUNTER — Ambulatory Visit (INDEPENDENT_AMBULATORY_CARE_PROVIDER_SITE_OTHER): Payer: BC Managed Care – PPO | Admitting: Otolaryngology

## 2018-11-16 DIAGNOSIS — J321 Chronic frontal sinusitis: Secondary | ICD-10-CM

## 2018-11-16 DIAGNOSIS — J338 Other polyp of sinus: Secondary | ICD-10-CM | POA: Diagnosis not present

## 2018-11-16 DIAGNOSIS — J322 Chronic ethmoidal sinusitis: Secondary | ICD-10-CM

## 2018-11-16 DIAGNOSIS — J320 Chronic maxillary sinusitis: Secondary | ICD-10-CM | POA: Diagnosis not present

## 2019-01-09 DIAGNOSIS — N939 Abnormal uterine and vaginal bleeding, unspecified: Secondary | ICD-10-CM | POA: Diagnosis not present

## 2019-01-09 DIAGNOSIS — N921 Excessive and frequent menstruation with irregular cycle: Secondary | ICD-10-CM | POA: Diagnosis not present

## 2019-01-09 DIAGNOSIS — N943 Premenstrual tension syndrome: Secondary | ICD-10-CM | POA: Diagnosis not present

## 2019-01-09 DIAGNOSIS — Z01419 Encounter for gynecological examination (general) (routine) without abnormal findings: Secondary | ICD-10-CM | POA: Diagnosis not present

## 2019-01-22 DIAGNOSIS — N939 Abnormal uterine and vaginal bleeding, unspecified: Secondary | ICD-10-CM | POA: Diagnosis not present

## 2019-02-06 DIAGNOSIS — D649 Anemia, unspecified: Secondary | ICD-10-CM | POA: Diagnosis not present

## 2019-02-06 DIAGNOSIS — N939 Abnormal uterine and vaginal bleeding, unspecified: Secondary | ICD-10-CM | POA: Diagnosis not present

## 2019-03-01 DIAGNOSIS — J324 Chronic pansinusitis: Secondary | ICD-10-CM | POA: Diagnosis not present

## 2019-03-01 DIAGNOSIS — J338 Other polyp of sinus: Secondary | ICD-10-CM | POA: Diagnosis not present

## 2019-05-07 DIAGNOSIS — Z309 Encounter for contraceptive management, unspecified: Secondary | ICD-10-CM | POA: Diagnosis not present

## 2019-05-07 DIAGNOSIS — N939 Abnormal uterine and vaginal bleeding, unspecified: Secondary | ICD-10-CM | POA: Diagnosis not present

## 2019-06-26 DIAGNOSIS — J338 Other polyp of sinus: Secondary | ICD-10-CM | POA: Diagnosis not present

## 2019-06-26 DIAGNOSIS — J310 Chronic rhinitis: Secondary | ICD-10-CM | POA: Diagnosis not present

## 2019-06-26 DIAGNOSIS — R430 Anosmia: Secondary | ICD-10-CM | POA: Diagnosis not present

## 2019-09-11 ENCOUNTER — Ambulatory Visit (INDEPENDENT_AMBULATORY_CARE_PROVIDER_SITE_OTHER): Payer: Self-pay | Admitting: Otolaryngology

## 2019-09-11 ENCOUNTER — Encounter (INDEPENDENT_AMBULATORY_CARE_PROVIDER_SITE_OTHER): Payer: Self-pay | Admitting: Otolaryngology

## 2019-09-11 ENCOUNTER — Other Ambulatory Visit: Payer: Self-pay

## 2019-09-11 VITALS — Temp 97.5°F

## 2019-09-11 DIAGNOSIS — J324 Chronic pansinusitis: Secondary | ICD-10-CM

## 2019-09-11 NOTE — Progress Notes (Signed)
HPI: Priscilla Fox is a 39 y.o. female who presents for evaluation of chronic sinus issues.  She has had 2 previous surgeries on her sinuses in 2018 and 2020.  I reviewed a CT scan performed in July 2020 that showed diffuse sinus opacification of the ethmoid final maxillary and sphenoid sinuses.  She has had surgery since that CT scan.  She complains of excessive amount of thick mucus which she is always clearing her throat and having a lot of thick mucus in the mornings frequently it is yellow in discharge.  She has been treated with steroids in the past with good response to the steroids.  She is presently using Flonase regularly as well as saline rinses and sometimes Mucinex.  But she continues to have chronic problems.  She also has history of asthma in the sinuses tend to make her asthma worse. She states that she has had allergy testing in the past did not have allergies. Her last surgery was in September 2020.  Past Medical History:  Diagnosis Date  . Anemia    Iron supplements taken in the past  . Asthma   . Infection    Yeast;not frequent  . Infection    BV;not frequent   Past Surgical History:  Procedure Laterality Date  . ETHMOIDECTOMY Bilateral 10/06/2018   Procedure: TOTAL ETHMOIDECTOMY WITH TISSUE REMOVAL;  Surgeon: Winnie Barsky Pies, MD;  Location: [facility];  Service: ENT;  Laterality: Bilateral;  . FRONTAL SINUS EXPLORATION Bilateral 10/06/2018   Procedure: ENDOSCOPIC FRONTAL RECESS EXPLORATION;  Surgeon: Carah Barrientes Pies, MD;  Location: [facility];  Service: ENT;  Laterality: Bilateral;  . MAXILLARY ANTROSTOMY Bilateral 10/06/2018   Procedure: ENDOSCOPIC MAXILLARY ANTROSTOMY WITH TISSUE REMOVAL;  Surgeon: Hillard Goodwine Pies, MD;  Location: [facility];  Service: ENT;  Laterality: Bilateral;  . SINUS ENDO WITH FUSION Bilateral 10/06/2018   Procedure: SINUS ENDO WITH FUSION;  Surgeon: Tonia Avino Pies, MD;  Location: [facility];  Service: ENT;  Laterality:  Bilateral;  . SPHENOIDECTOMY Bilateral 10/06/2018   Procedure: TOTAL SPHENOIDECTOMY WITH TISSUE REMOVAL;  Surgeon: Mairany Bruno Pies, MD;  Location: [facility];  Service: ENT;  Laterality: Bilateral;  . TURBINATE REDUCTION Bilateral 10/06/2018   Procedure: PARTIAL INFERIOR TURBINATE REDUCTION;  Surgeon: Harman Ferrin Pies, MD;  Location: [facility];  Service: ENT;  Laterality: Bilateral;  . TURBINATE RESECTION    . WISDOM TOOTH EXTRACTION  2003   All 4 removed   Social History   Socioeconomic History  . Marital status: Married    Spouse name: Ismerai Bin  . Number of children: 2  . Years of education: 79  . Highest education level: Not on file  Occupational History  . Occupation: Production designer, theatre/television/film  Tobacco Use  . Smoking status: Former Smoker    Packs/day: 0.33    Years: 10.00    Pack years: 3.30    Types: Cigarettes    Quit date: 11/22/2011    Years since quitting: 7.8  . Smokeless tobacco: Never Used  . Tobacco comment: on and off social smoker  Substance and Sexual Activity  . Alcohol use: Not Currently  . Drug use: No  . Sexual activity: Yes  Other Topics Concern  . Not on file  Social History Narrative  . Not on file   Social Determinants of Health   Financial Resource Strain:   . Difficulty of Paying Living Expenses: Not on file  Food Insecurity:   . Worried About [employer] of  Food in the Last Year: Not on file  . Ran Out of Food in the Last Year: Not on file  Transportation Needs:   . Lack of Transportation (Medical): Not on file  . Lack of Transportation (Non-Medical): Not on file  Physical Activity:   . Days of Exercise per Week: Not on file  . Minutes of Exercise per Session: Not on file  Stress:   . Feeling of Stress : Not on file  Social Connections:   . Frequency of Communication with Friends and Family: Not on file  . Frequency of Social Gatherings with Friends and Family: Not on file  . Attends Religious Services: Not on file  . Active Member  of Clubs or Organizations: Not on file  . Attends Banker Meetings: Not on file  . Marital Status: Not on file   Family History  Problem Relation Age of Onset  . Hypothyroidism Mother   . Hypertension Maternal Grandmother   . Stroke Maternal Grandmother        d/t alcoholism  . Diabetes Paternal Grandmother    No Known Allergies Prior to Admission medications   Medication Sig Start Date End Date Taking? Authorizing Provider  budesonide-formoterol (SYMBICORT) 160-4.5 MCG/ACT inhaler Inhale 2 puffs into the lungs 2 (two) times daily.   Yes Alver Fisher, RN  predniSONE (STERAPRED UNI-PAK 21 TAB) 5 MG (21) TBPK tablet Take 5 mg by mouth daily. Patient not taking: Reported on 09/11/2019    Alver Fisher, RN     Positive ROS: Otherwise negative  All other systems have been reviewed and were otherwise negative with the exception of those mentioned in the HPI and as above.  Physical Exam: Constitutional: Alert, well-appearing, no acute distress Ears: External ears without lesions or tenderness. Ear canals are clear bilaterally with intact, clear TMs.  Nasal: External nose without lesions. Septum is midline..  She is very edematous middle meatus bilaterally with thick mucoid discharge especially on the right side which has some color to it.  She is presently having decreased sense of smell. Oral: Lips and gums without lesions. Tongue and palate mucosa without lesions. Posterior oropharynx clear. Neck: No palpable adenopathy or masses Respiratory: Breathing comfortably  Skin: No facial/neck lesions or rash noted.  Procedures  Assessment: Chronic bilateral sinus disease with history of asthma.  She has had 2 previous surgeries.  Plan: Placed her on Augmentin 875 mg twice daily for 2 weeks.  Placed on a Sterapred Dosepak for 6 days. Also recommend using XHANCE 2 sprays twice daily for the next 2 months. She will continue with use of the saline irrigations and Mucinex as  needed. She will follow-up in 6 to 8 weeks for recheck.  Narda Bonds, MD

## 2019-10-23 ENCOUNTER — Encounter (INDEPENDENT_AMBULATORY_CARE_PROVIDER_SITE_OTHER): Payer: Self-pay | Admitting: Otolaryngology

## 2019-10-23 ENCOUNTER — Other Ambulatory Visit: Payer: Self-pay

## 2019-10-23 ENCOUNTER — Ambulatory Visit (INDEPENDENT_AMBULATORY_CARE_PROVIDER_SITE_OTHER): Payer: BC Managed Care – PPO | Admitting: Otolaryngology

## 2019-10-23 VITALS — Temp 98.1°F

## 2019-10-23 DIAGNOSIS — J339 Nasal polyp, unspecified: Secondary | ICD-10-CM

## 2019-10-23 DIAGNOSIS — R430 Anosmia: Secondary | ICD-10-CM

## 2019-10-23 NOTE — Progress Notes (Signed)
HPI: Priscilla Fox is a 39 y.o. female who returns today for evaluation of chronic sinuses and sinonasal polyps.  She has had previous surgery in 2018 as well as a second surgery with Dr. Suszanne Conners in September 2020. I had seen her previously a couple months ago and treated with Xhance and Augmentin. She does not complain of the any difficulty breathing through her nose.  She does not have any sense of smell unless she takes steroids which she does not want to take.  She has no pain or pressure.  But she does complain of a lot of mucus with postnasal drainage and occasional wheezing as well as itching in her throat.  She is presently using XHANCE twice a day and NeilMed nasal sinus rinses. Dr. Suszanne Conners had previously discussed possible use of Dupixent but she was not interested in using this secondary to cost..  Past Medical History:  Diagnosis Date  . Anemia    Iron supplements taken in the past  . Asthma   . Infection    Yeast;not frequent  . Infection    BV;not frequent   Past Surgical History:  Procedure Laterality Date  . ETHMOIDECTOMY Bilateral 10/06/2018   Procedure: TOTAL ETHMOIDECTOMY WITH TISSUE REMOVAL;  Surgeon: Moriah Shawley Pies, MD;  Location: [facility];  Service: ENT;  Laterality: Bilateral;  . FRONTAL SINUS EXPLORATION Bilateral 10/06/2018   Procedure: ENDOSCOPIC FRONTAL RECESS EXPLORATION;  Surgeon: Henderson Frampton Pies, MD;  Location: [facility];  Service: ENT;  Laterality: Bilateral;  . MAXILLARY ANTROSTOMY Bilateral 10/06/2018   Procedure: ENDOSCOPIC MAXILLARY ANTROSTOMY WITH TISSUE REMOVAL;  Surgeon: Dahlton Hinde Pies, MD;  Location: [facility];  Service: ENT;  Laterality: Bilateral;  . SINUS ENDO WITH FUSION Bilateral 10/06/2018   Procedure: SINUS ENDO WITH FUSION;  Surgeon: Kreig Parson Pies, MD;  Location: [facility];  Service: ENT;  Laterality: Bilateral;  . SPHENOIDECTOMY Bilateral 10/06/2018   Procedure: TOTAL SPHENOIDECTOMY WITH TISSUE REMOVAL;  Surgeon:  Xavius Spadafore Pies, MD;  Location: [facility];  Service: ENT;  Laterality: Bilateral;  . TURBINATE REDUCTION Bilateral 10/06/2018   Procedure: PARTIAL INFERIOR TURBINATE REDUCTION;  Surgeon: Zennie Ayars Pies, MD;  Location: [facility];  Service: ENT;  Laterality: Bilateral;  . TURBINATE RESECTION    . WISDOM TOOTH EXTRACTION  2003   All 4 removed   Social History   Socioeconomic History  . Marital status: Married    Spouse name: Suheily Birks  . Number of children: 2  . Years of education: 80  . Highest education level: Not on file  Occupational History  . Occupation: Production designer, theatre/television/film  Tobacco Use  . Smoking status: Former Smoker    Packs/day: 0.33    Years: 10.00    Pack years: 3.30    Types: Cigarettes    Quit date: 11/22/2011    Years since quitting: 7.9  . Smokeless tobacco: Never Used  . Tobacco comment: on and off social smoker  Substance and Sexual Activity  . Alcohol use: Not Currently  . Drug use: No  . Sexual activity: Yes  Other Topics Concern  . Not on file  Social History Narrative  . Not on file   Social Determinants of Health   Financial Resource Strain:   . Difficulty of Paying Living Expenses: Not on file  Food Insecurity:   . Worried About Programme researcher, broadcasting/film/video in the Last Year: Not on file  . Ran Out of Food in the Last Year: Not on file  Transportation Needs:   . Freight forwarder (Medical): Not on file  . Lack of Transportation (Non-Medical): Not on file  Physical Activity:   . Days of Exercise per Week: Not on file  . Minutes of Exercise per Session: Not on file  Stress:   . Feeling of Stress : Not on file  Social Connections:   . Frequency of Communication with Friends and Family: Not on file  . Frequency of Social Gatherings with Friends and Family: Not on file  . Attends Religious Services: Not on file  . Active Member of Clubs or Organizations: Not on file  . Attends Banker Meetings: Not on file  . Marital Status:  Not on file   Family History  Problem Relation Age of Onset  . Hypothyroidism Mother   . Hypertension Maternal Grandmother   . Stroke Maternal Grandmother        d/t alcoholism  . Diabetes Paternal Grandmother    No Known Allergies Prior to Admission medications   Medication Sig Start Date End Date Taking? Authorizing Provider  budesonide-formoterol (SYMBICORT) 160-4.5 MCG/ACT inhaler Inhale 2 puffs into the lungs 2 (two) times daily.    Alver Fisher, RN  predniSONE (STERAPRED UNI-PAK 21 TAB) 5 MG (21) TBPK tablet Take 5 mg by mouth daily. Patient not taking: Reported on 09/11/2019    Alver Fisher, RN     Positive ROS: Otherwise negative  All other systems have been reviewed and were otherwise negative with the exception of those mentioned in the HPI and as above.  Physical Exam: Constitutional: Alert, well-appearing, no acute distress Ears: External ears without lesions or tenderness. Ear canals are clear bilaterally with intact, clear TMs.  Nasal: External nose without lesions. Septum midline with mild rhinitis.  She has polyps within the middle meatus but they do not extend into the nasal cavity.  No obvious mucopurulent discharge noted.Marland Kitchen  However patient does describe that she is coughed up some colored discharge. Oral: Lips and gums without lesions. Tongue and palate mucosa without lesions. Posterior oropharynx clear. Neck: No palpable adenopathy or masses Respiratory: Breathing comfortably  Skin: No facial/neck lesions or rash noted.  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  Plan: Discussed with her concerning regular use of XHANCE at least once a day. She can occasionally take steroids a couple weeks a year but she wants to avoid these. Gave her information on Dupixent and suggested researching this. Also suggested trying Xlear saline irrigations to see if this helps at all with the mucus versus the  NeilMed. Gave her a prescription for Augmentin 875 mg twice daily for 10 days if she develops any yellow-green discharge. She will follow-up as needed. Recommended consideration of surgery if she has trouble breathing through her nose or chronic pain or pressure in the sinuses but will have to repeat CT scan prior to considering this.  There is no indication for surgery presently on clinical exam   Narda Bonds, MD

## 2020-01-08 ENCOUNTER — Other Ambulatory Visit (INDEPENDENT_AMBULATORY_CARE_PROVIDER_SITE_OTHER): Payer: Self-pay | Admitting: Otolaryngology

## 2021-05-21 IMAGING — CT CT MAXILLOFACIAL WITHOUT CONTRAST
3 of 5 series · 14 of 47 positions shown, 16 images · non-contrast
Comparison: 07/11/16

CLINICAL DATA: Eval recurrent polyps.

EXAM:
CT MAXILLOFACIAL WITHOUT CONTRAST
TECHNIQUE: Multidetector CT images of the paranasal sinuses were obtained using
the standard protocol without intravenous contrast.

[Series 4: sinus 2.00 hr60 s3 cor · coronal · 0.28mm/px · 3 of 109 slices shown]
[im 37/109  bone]
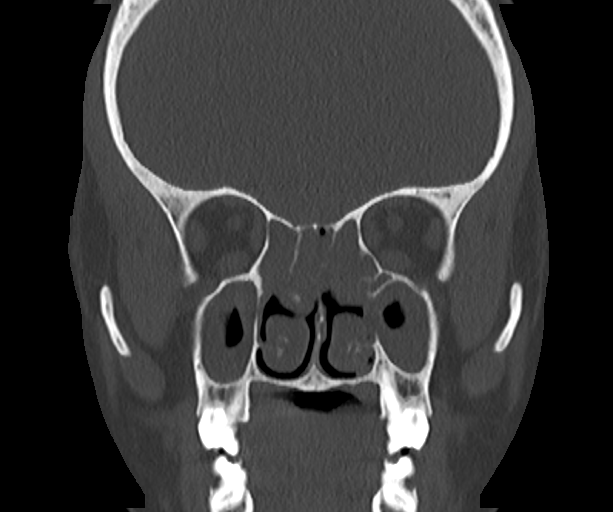
[im 49/109  bone]
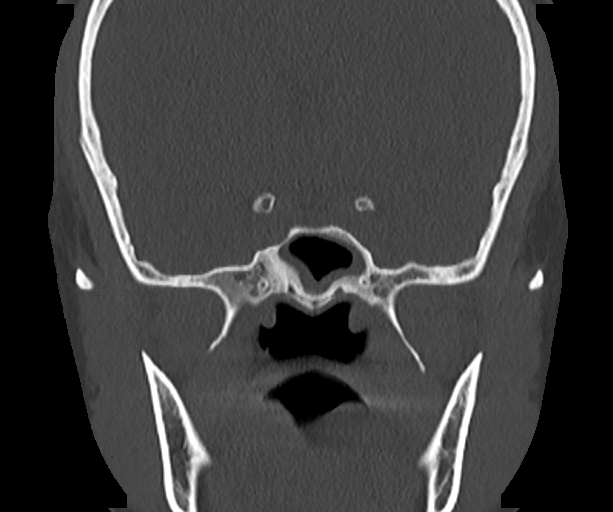
[im 61/109  bone]
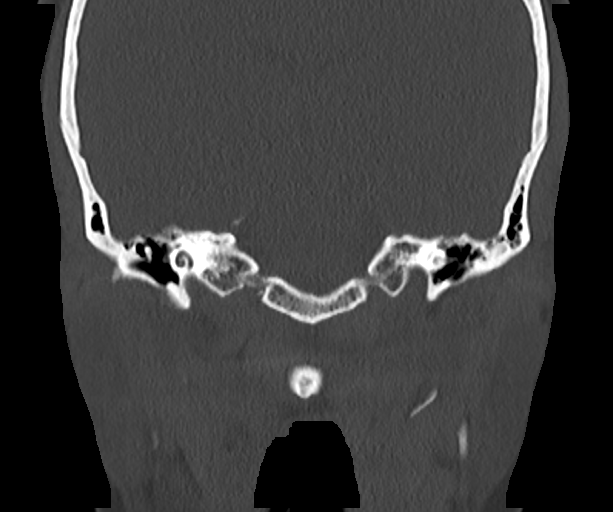

[Series 6: sinus 2.00 hr60 s3 sag · sagittal · 0.28mm/px · 3 of 85 slices shown]
[im 29/85  bone]
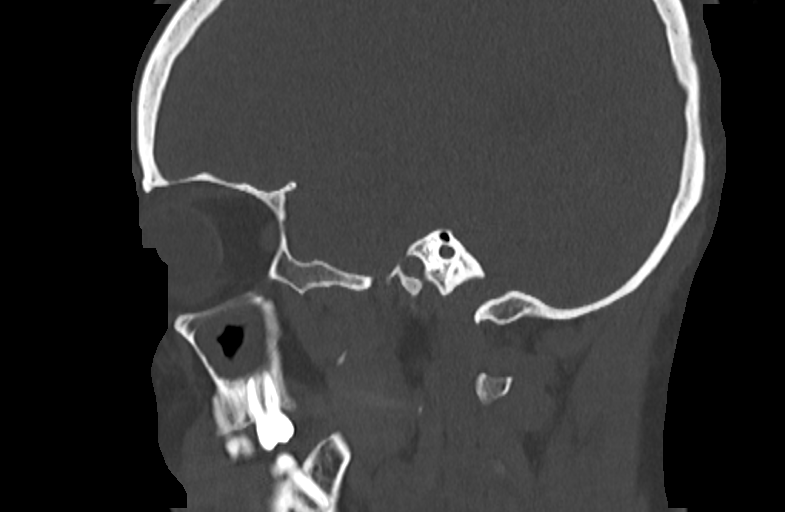
[im 43/85  bone]
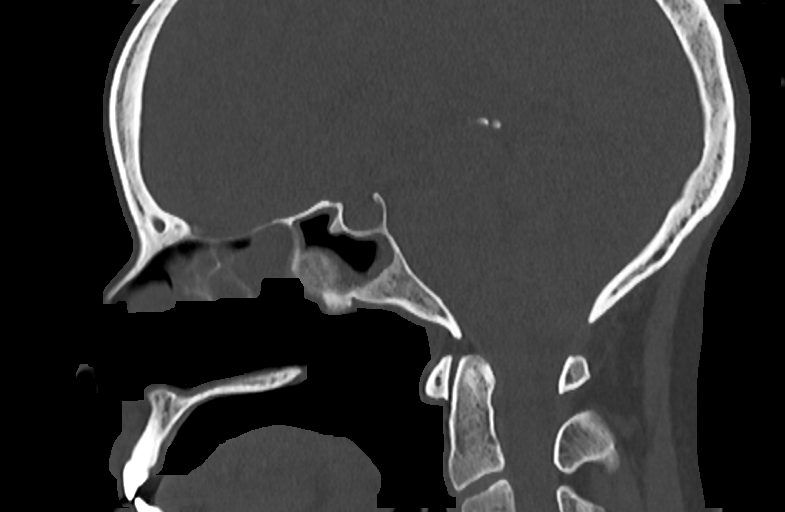
[im 57/85  bone]
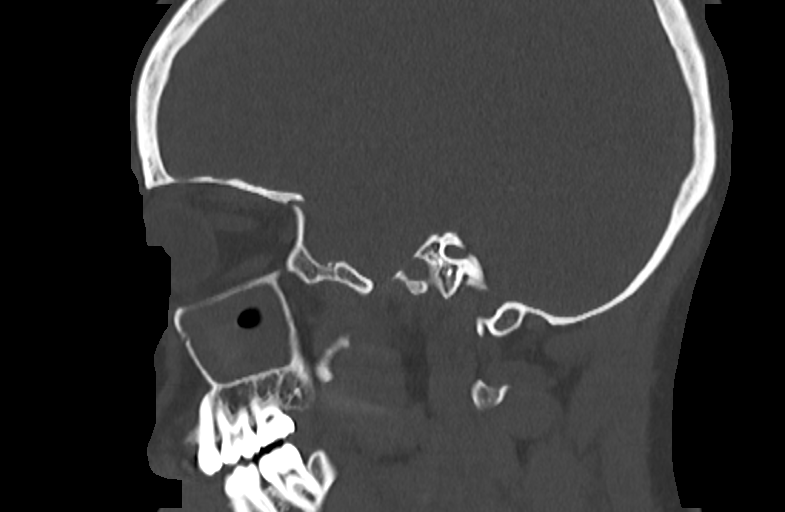

[Series 12: sinus 1.00 hr60 s3 axial fusion thins · axial · 0.43mm/px · z∈[-608,-499]mm · 8 of 236 slices shown, 10 images]
[im 27/236  brain]
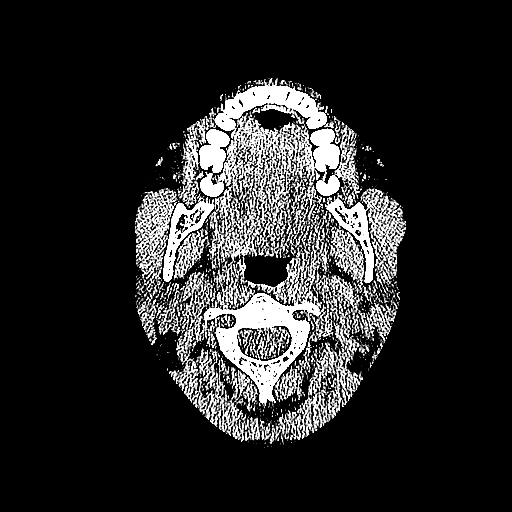
[im 27/236  bone]
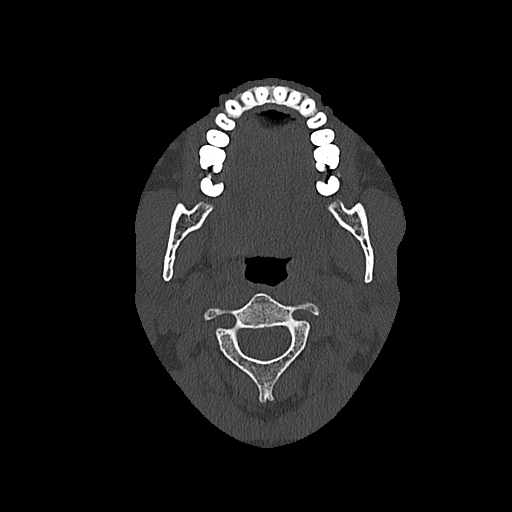
[im 53/236  bone]
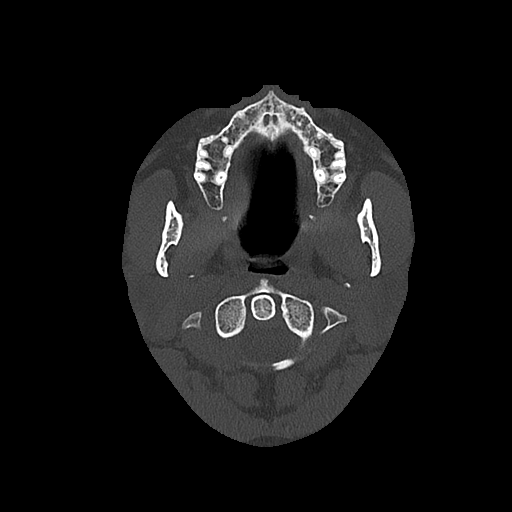
[im 79/236  bone]
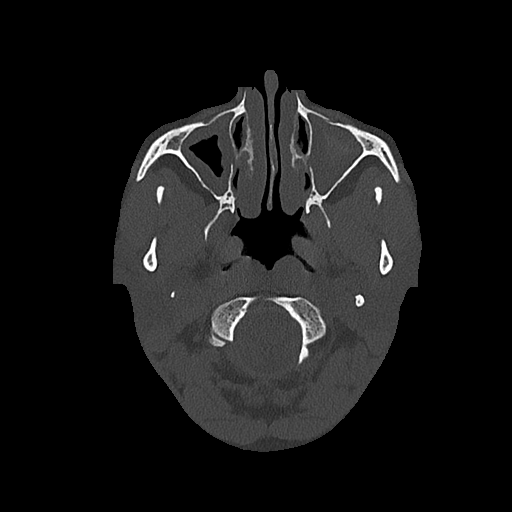
[im 105/236  bone]
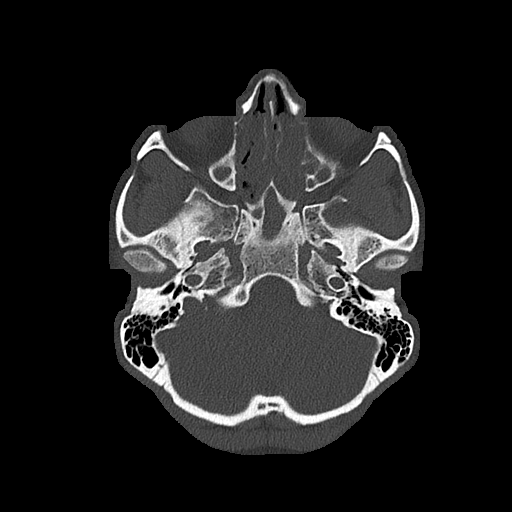
[im 131/236  brain]
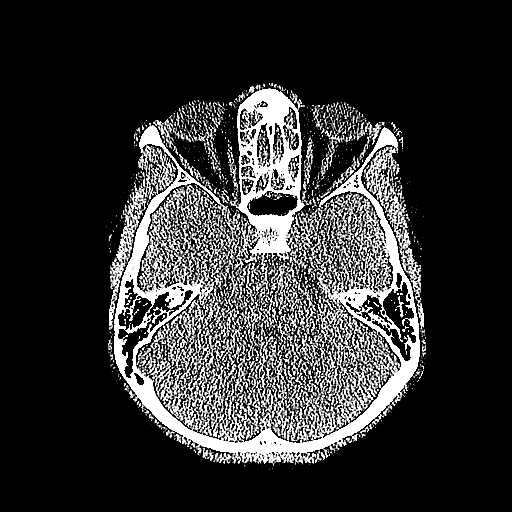
[im 131/236  bone]
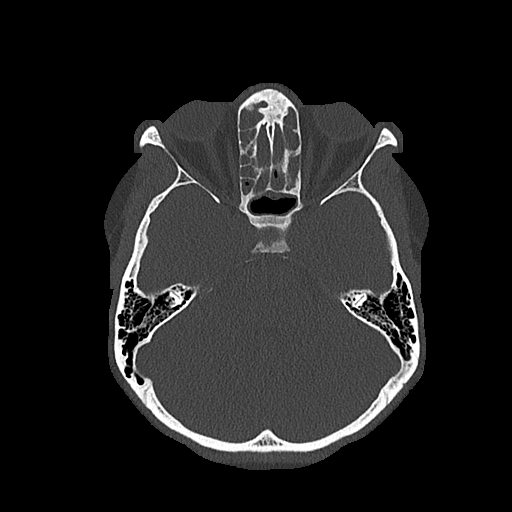
[im 157/236  bone]
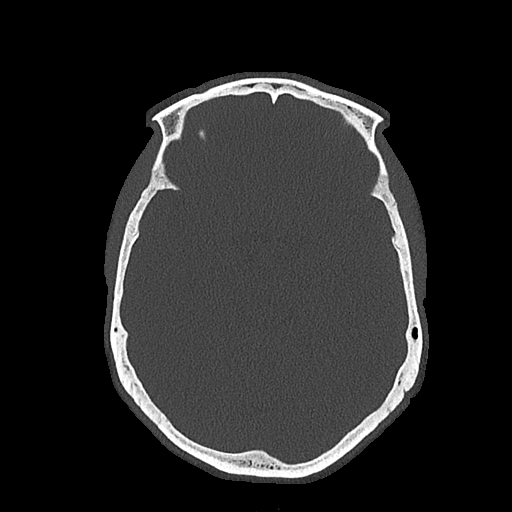
[im 183/236  bone]
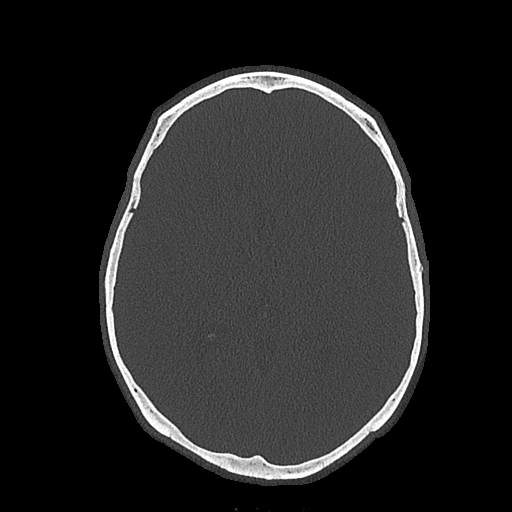
[im 209/236  bone]
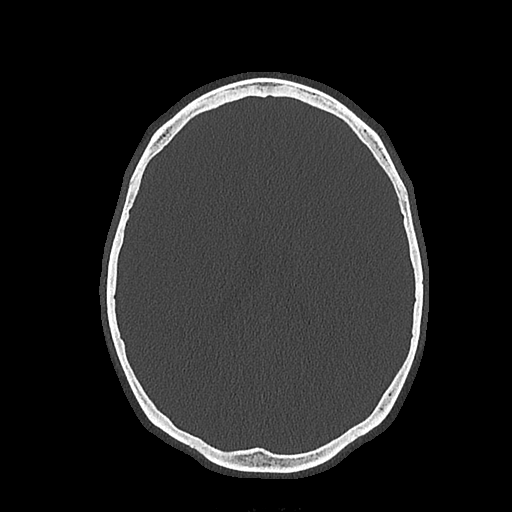

[14 of 47 positions shown; findings below may reference images not displayed]

FINDINGS: Paranasal sinuses:

Frontal: Small frontal sinuses that are completely opacified.

Ethmoid: Completely opacified on both sides.

Maxillary: Extensive mucosal thickening on both sides with high
density secretions on the left.

Sphenoid: Left larger than right sinus with moderate mucosal
thickening on the left and complete opacification on the right,
including high-density secretions centrally.

Right ostiomeatal unit: Antrostomy that is opacified by mucosal
thickening. Partial turbinectomy.

Left ostiomeatal unit: Antrostomy that is opacified by mucosal
thickening.Partial turbinectomy.

Nasal passages: Completely effaced upper nasal cavity on both sides
bylobulated soft tissue density.

Anatomy: No pneumatization superior to anterior ethmoid notches.
Sellar sphenoid pneumatization pattern. No dehiscence of carotid or
optic canals. No onodi cell.

Other: Orbits and intracranial compartment are unremarkable. Visible
mastoid air cells are normally aerated.
IMPRESSION: 1. Advanced generalized sinusitis with polypoid features in the
nasal cavity.
2. Bilateral maxillary antrostomy that is opacified by mucosal
thickening

## 2021-07-15 ENCOUNTER — Ambulatory Visit (INDEPENDENT_AMBULATORY_CARE_PROVIDER_SITE_OTHER): Payer: BC Managed Care – PPO | Admitting: Podiatry

## 2021-07-15 ENCOUNTER — Encounter: Payer: Self-pay | Admitting: Podiatry

## 2021-07-15 ENCOUNTER — Ambulatory Visit (INDEPENDENT_AMBULATORY_CARE_PROVIDER_SITE_OTHER): Payer: BC Managed Care – PPO

## 2021-07-15 DIAGNOSIS — M21611 Bunion of right foot: Secondary | ICD-10-CM | POA: Diagnosis not present

## 2021-07-15 DIAGNOSIS — M21619 Bunion of unspecified foot: Secondary | ICD-10-CM | POA: Diagnosis not present

## 2021-07-15 DIAGNOSIS — M21612 Bunion of left foot: Secondary | ICD-10-CM

## 2021-07-15 NOTE — Progress Notes (Signed)
Subjective:   Patient ID: Priscilla Fox, female   DOB: 41 y.o.   MRN: [id number]   HPI Patient presents with a painful bunion deformity right stating that she is tried wider shoes and that boots really bother her and sandals in the summer do not bother her.  Does not smoke likes to be active   Review of Systems  All other systems reviewed and are negative.       Objective:  Physical Exam Vitals and nursing note reviewed.  Constitutional:      Appearance: She is well-developed.  Pulmonary:     Effort: Pulmonary effort is normal.  Musculoskeletal:        General: Normal range of motion.  Skin:    General: Skin is warm.  Neurological:     Mental Status: She is alert.     Neurovascular status intact muscle strength found to be adequate range of motion adequate with prominence around the first metatarsal head right mild redness mild discomfort when pressed no increased fluid buildup around the joint surface with no other pathology noted.  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  All questions answered concerning surgery today  X-rays indicate there is structural HAV deformity with elevation of the intermetatarsal angle right

## 2021-07-15 NOTE — Patient Instructions (Signed)

## 2023-07-06 ENCOUNTER — Other Ambulatory Visit: Payer: Self-pay | Admitting: Family Medicine

## 2023-07-06 ENCOUNTER — Ambulatory Visit
Admission: RE | Admit: 2023-07-06 | Discharge: 2023-07-06 | Disposition: A | Discharge status: Home / Self Care | Payer: Self-pay | Source: Ambulatory Visit | Attending: Family Medicine | Admitting: Family Medicine

## 2023-07-06 DIAGNOSIS — R059 Cough, unspecified: Secondary | ICD-10-CM

## 2023-08-03 ENCOUNTER — Inpatient Hospital Stay

## 2023-08-03 ENCOUNTER — Inpatient Hospital Stay: Attending: Hematology and Oncology | Admitting: Hematology and Oncology

## 2023-08-03 VITALS — BP 114/81 | HR 84 | Temp 97.8°F | Resp 14 | Wt 135.9 lb

## 2023-08-03 DIAGNOSIS — Z87891 Personal history of nicotine dependence: Secondary | ICD-10-CM | POA: Diagnosis not present

## 2023-08-03 DIAGNOSIS — D500 Iron deficiency anemia secondary to blood loss (chronic): Secondary | ICD-10-CM | POA: Insufficient documentation

## 2023-08-03 DIAGNOSIS — N920 Excessive and frequent menstruation with regular cycle: Secondary | ICD-10-CM | POA: Insufficient documentation

## 2023-08-03 LAB — IRON AND IRON BINDING CAPACITY (CC-WL,HP ONLY)
Iron: 100 ug/dL (ref 28–170)
Saturation Ratios: 20 % (ref 10.4–31.8)
TIBC: 493 ug/dL — ABNORMAL HIGH (ref 250–450)
UIBC: 393 ug/dL (ref 148–442)

## 2023-08-03 LAB — CMP (CANCER CENTER ONLY)
ALT: 8 U/L (ref 0–44)
AST: 16 U/L (ref 15–41)
Albumin: 4.2 g/dL (ref 3.5–5.0)
Alkaline Phosphatase: 60 U/L (ref 38–126)
Anion gap: 4 — ABNORMAL LOW (ref 5–15)
BUN: 9 mg/dL (ref 6–20)
CO2: 29 mmol/L (ref 22–32)
Calcium: 9.3 mg/dL (ref 8.9–10.3)
Chloride: 104 mmol/L (ref 98–111)
Creatinine: 0.75 mg/dL (ref 0.44–1.00)
GFR, Estimated: >60 mL/min (ref >60)
Glucose, Bld: 85 mg/dL (ref 70–99)
Potassium: 4.1 mmol/L (ref 3.5–5.1)
Sodium: 137 mmol/L (ref 135–145)
Total Bilirubin: 0.3 mg/dL (ref 0.0–1.2)
Total Protein: 7.4 g/dL (ref 6.5–8.1)

## 2023-08-03 LAB — CBC WITH DIFFERENTIAL (CANCER CENTER ONLY)
Abs Immature Granulocytes: 0.01 K/uL (ref 0.00–0.07)
Basophils Absolute: 0.1 K/uL (ref 0.0–0.1)
Basophils Relative: 2 %
Eosinophils Absolute: 0.6 K/uL — ABNORMAL HIGH (ref 0.0–0.5)
Eosinophils Relative: 14 %
HCT: 32.8 % — ABNORMAL LOW (ref 36.0–46.0)
Hemoglobin: 9.8 g/dL — ABNORMAL LOW (ref 12.0–15.0)
Immature Granulocytes: 0 %
Lymphocytes Relative: 30 %
Lymphs Abs: 1.3 K/uL (ref 0.7–4.0)
MCH: 24.6 pg — ABNORMAL LOW (ref 26.0–34.0)
MCHC: 29.9 g/dL — ABNORMAL LOW (ref 30.0–36.0)
MCV: 82.2 fL (ref 80.0–100.0)
Monocytes Absolute: 0.4 K/uL (ref 0.1–1.0)
Monocytes Relative: 9 %
Neutro Abs: 2 K/uL (ref 1.7–7.7)
Neutrophils Relative %: 45 %
Platelet Count: 314 K/uL (ref 150–400)
RBC: 3.99 MIL/uL (ref 3.87–5.11)
RDW: 19.9 % — ABNORMAL HIGH (ref 11.5–15.5)
WBC Count: 4.4 K/uL (ref 4.0–10.5)
nRBC: 0 % (ref 0.0–0.2)

## 2023-08-03 LAB — RETIC PANEL
Immature Retic Fract: 22.2 % — ABNORMAL HIGH (ref 2.3–15.9)
RBC.: 4 MIL/uL (ref 3.87–5.11)
Retic Count, Absolute: 74.4 K/uL (ref 19.0–186.0)
Retic Ct Pct: 1.9 % (ref 0.4–3.1)
Reticulocyte Hemoglobin: 26.6 pg — ABNORMAL LOW (ref >27.9)

## 2023-08-03 LAB — FERRITIN: Ferritin: 12 ng/mL (ref 11–307)

## 2023-08-03 NOTE — Progress Notes (Signed)
 [facility] [phone]   [phone]  INITIAL CONSULT NOTE  Patient Care Team: Patient, No Pcp Per as PCP - General (General Practice)  Hematological/Oncological History # Iron Deficiency Anemia 2/2 to GYN Bleeding  07/12/2023: WBC 4.9, Hgb 8.7, MCV 82.3, Plt 289. Ferritin 3, Iron sat 3%.  08/03/2023: establish care with Dr. Federico   CHIEF COMPLAINTS/PURPOSE OF CONSULTATION:  Iron Deficiency Anemia   HISTORY OF PRESENTING ILLNESS:  Priscilla Fox 43 y.o. female with medical history significant for asthma who presents for evaluation of iron deficiency anemia.   On review of the previous records Priscilla Fox had labs drawn on 07/12/2023 which revealed WBC 4.9, Hgb 8.7, MCV 82.3, Plt 289. Ferritin 3, Iron sat 3%.  She was started on p.o. iron therapy and referred to our clinic for further evaluation and management.  On exam today Priscilla Fox reports that she was told by her primary care provider that she had severely low iron.  She started iron pills last Monday and it is a combination pill with vitamin C.  She reports she is tolerating it well with no stomach upset.  She reports she eats a regular diet and enjoys eating red meat 2-3 times per week.  She also enjoys dark green leafy vegetables.  She reports that she does have heavy menstrual cycles that last for 7 to 9 days.  She reports on the heaviest days she can go through a pad every 1 hour and they are completely soaked.  She reports that she follows with OB/GYN and is not currently on any medications to control her menstrual cycles.  She reports that she is not having any bleeding elsewhere other than a small hemorrhoid which can occasionally bleed.  On further discussion she reports her maternal aunt and her son have low hemoglobin.  She reports her mom and dad have hypertension hyperlipidemia.  She has 1 brother and 1 sister both of whom are healthy.  She has 3 healthy children.  She does vape and has been  doing this for about 1 year.  She reports that she has not had any alcohol since last Thursday because last time she drank she felt quite unwell the next day.  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  She knows she does have some shortness of breath on exertion and occasional bouts of low energy.  She does crave ice.  Otherwise she denies any fevers, chills, sweats, nausea, vomiting or diarrhea.  Full 10 point ROS is otherwise negative.  MEDICAL HISTORY:  Past Medical History:  Diagnosis Date   Anemia    Iron supplements taken in the past   Asthma    Infection    Yeast;not frequent   Infection    BV;not frequent    SURGICAL HISTORY: Past Surgical History:  Procedure Laterality Date   ETHMOIDECTOMY Bilateral 10/06/2018   Procedure: TOTAL ETHMOIDECTOMY WITH TISSUE REMOVAL;  Surgeon: Karis Clunes, MD;  Location: [facility];  Service: ENT;  Laterality: Bilateral;   FRONTAL SINUS EXPLORATION Bilateral 10/06/2018   Procedure: ENDOSCOPIC FRONTAL RECESS EXPLORATION;  Surgeon: Karis Clunes, MD;  Location: [facility];  Service: ENT;  Laterality: Bilateral;   MAXILLARY ANTROSTOMY Bilateral 10/06/2018   Procedure: ENDOSCOPIC MAXILLARY ANTROSTOMY WITH TISSUE REMOVAL;  Surgeon: Karis Clunes, MD;  [facility];  Service: ENT;  Laterality: Bilateral;   SINUS ENDO WITH FUSION Bilateral 10/06/2018   Procedure: SINUS ENDO WITH FUSION;  Surgeon: Karis Clunes, MD;  Location: [facility];  Service: ENT;  Laterality: Bilateral;   SPHENOIDECTOMY Bilateral 10/06/2018   Procedure: TOTAL SPHENOIDECTOMY WITH TISSUE REMOVAL;  Surgeon: Karis Clunes, MD;  Location: [facility];  Service: ENT;  Laterality: Bilateral;   TURBINATE REDUCTION Bilateral 10/06/2018   Procedure: PARTIAL INFERIOR TURBINATE REDUCTION;  Surgeon: Karis Clunes, MD;  Location: [facility];  Service: ENT;  Laterality: Bilateral;   TURBINATE  RESECTION     WISDOM TOOTH EXTRACTION  2003   All 4 removed    SOCIAL HISTORY: Social History   Socioeconomic History   Marital status: Married    Spouse name: Theatre manager   Number of children: 2   Years of education: 16   Highest education level: Not on file  Occupational History   Occupation: Production designer, theatre/television/film  Tobacco Use   Smoking status: Former    Current packs/day: 0.00    Average packs/day: 0.3 packs/day for 10.0 years (3.3 ttl pk-yrs)    Types: Cigarettes    Start date: 11/21/2001    Quit date: 11/22/2011    Years since quitting: 11.7   Smokeless tobacco: Never   Tobacco comments:    on and off social smoker  Substance and Sexual Activity   Alcohol use: Not Currently   Drug use: No   Sexual activity: Yes  Other Topics Concern   Not on file  Social History Narrative   Not on file   Social Drivers of Health   Financial Resource Strain: Not on file  Food Insecurity: No Food Insecurity (08/03/2023)   Hunger Vital Sign    Worried About Running Out of Food in the Last Year: Never true    Ran Out of Food in the Last Year: Never true  Transportation Needs: No Transportation Needs (08/03/2023)   PRAPARE - Administrator, Civil Service (Medical): No    Lack of Transportation (Non-Medical): No  Physical Activity: Not on file  Stress: Not on file  Social Connections: Not on file  Intimate Partner Violence: Not At Risk (08/03/2023)   Humiliation, Afraid, Rape, and Kick questionnaire    Fear of Current or Ex-Partner: No    Emotionally Abused: No    Physically Abused: No    Sexually Abused: No    FAMILY HISTORY: Family History  Problem Relation Age of Onset   Hypothyroidism Mother    Hypertension Maternal Grandmother    Stroke Maternal Grandmother        d/t alcoholism   Diabetes Paternal Grandmother     ALLERGIES:  has no known allergies.  MEDICATIONS:  Current Outpatient Medications  Medication Sig Dispense Refill   budesonide-formoterol (SYMBICORT)  160-4.5 MCG/ACT inhaler Inhale 2 puffs into the lungs 2 (two) times daily.     Iron-Vitamin C 100-250 MG TABS Take 1 tablet by mouth every morning.     albuterol (VENTOLIN HFA) 108 (90 Base) MCG/ACT inhaler Inhale 2 puffs into the lungs every 4 (four) hours as needed for wheezing.     No current facility-administered medications for this visit.    REVIEW OF SYSTEMS:   Constitutional: ( - ) fevers, ( - )  chills , ( - ) night sweats Eyes: ( - ) blurriness of vision, ( - ) double vision, ( - ) watery eyes Ears, nose, mouth, throat, and face: ( - ) mucositis, ( - ) sore throat Respiratory: ( - ) cough, ( - ) dyspnea, ( - ) wheezes Cardiovascular: ( - )  palpitation, ( - ) chest discomfort, ( - ) lower extremity swelling Gastrointestinal:  ( - ) nausea, ( - ) heartburn, ( - ) change in bowel habits Skin: ( - ) abnormal skin rashes Lymphatics: ( - ) new lymphadenopathy, ( - ) easy bruising Neurological: ( - ) numbness, ( - ) tingling, ( - ) new weaknesses Behavioral/Psych: ( - ) mood change, ( - ) new changes  All other systems were reviewed with the patient and are negative.  PHYSICAL EXAMINATION:  Vitals:   08/03/23 0856  BP: 114/81  Pulse: 84  Resp: 14  Temp: 97.8 F (36.6 C)  SpO2: 100%   Filed Weights   08/03/23 0856  Weight: 135 lb 14.4 oz (61.6 kg)    GENERAL: well appearing middle-age African-American female in NAD  SKIN: skin color, texture, turgor are normal, no rashes or significant lesions EYES: conjunctiva are pink and non-injected, sclera clear LUNGS: clear to auscultation and percussion with normal breathing effort HEART: regular rate & rhythm and no murmurs and no lower extremity edema Musculoskeletal: no cyanosis of digits and no clubbing  PSYCH: alert & oriented x 3, fluent speech NEURO: no focal motor/sensory deficits  LABORATORY DATA:  I have reviewed the data as listed    Latest Ref Rng & Units 12/08/2011   12:09 PM 09/25/2006    5:41 AM 09/24/2006     2:39 PM  CBC  WBC 3.4 - 10.8 x10E3/uL 5.9  8.5  6.3   Hemoglobin 11.1 - 15.9 g/dL 88.9  88.3  87.3   Hematocrit 34.0 - 46.6 % 34.1  33.5  36.2   Platelets 155 - 379 x10E3/uL 228  149  153         No data to display           ASSESSMENT & PLAN Chrysten Woulfe 43 y.o. female with medical history significant for asthma who presents for evaluation of iron deficiency anemia.   After review of the labs, review of the records, and discussion with the patient the patients findings are most consistent with iron deficiency anemia secondary to GYN bleeding.  # Iron Deficiency Anemia 2/2 to GYN Bleeding -- Findings are consistent with iron deficiency anemia secondary to patient's menorrhagia --Encouraged her to follow-up with OB/GYN for better control of her menstrual cycles --We will confirm iron deficiency anemia by ordering iron panel and ferritin as well as reticulocytes, CBC, and CMP --Continue PO iron pills daily with a source of vitamin C --We will plan to proceed with IV iron therapy in order to help bolster the patient's blood counts --Plan for return to clinic in 4 to 6 weeks time after last dose of IV iron   Orders Placed This Encounter  Procedures   CBC with Differential (Cancer Center Only)    Standing Status:   Future    Number of Occurrences:   1    Expiration Date:   08/02/2024   CMP (Cancer Center only)    Standing Status:   Future    Number of Occurrences:   1    Expiration Date:   08/02/2024   Ferritin    Standing Status:   Future    Number of Occurrences:   1    Expiration Date:   08/02/2024   Iron and Iron Binding Capacity ([facility] only)    Standing Status:   Future    Number of Occurrences:   1    Expiration Date:   08/02/2024   Retic  Panel    Standing Status:   Future    Number of Occurrences:   1    Expiration Date:   08/02/2024    All questions were answered. The patient knows to call the clinic with any problems, questions or concerns.  A total of more  than 60 minutes were spent on this encounter with face-to-face time and non-face-to-face time, including preparing to see the patient, ordering tests and/or medications, counseling the patient and coordination of care as outlined above.   Norleen IVAR Kidney, MD Department of Hematology/Oncology [facility] Phone: [phone] Pager: [phone] Email: [email or web address]  08/03/2023 9:33 AM

## 2023-08-04 ENCOUNTER — Other Ambulatory Visit: Payer: Self-pay | Admitting: Hematology and Oncology

## 2023-08-04 ENCOUNTER — Telehealth: Payer: Self-pay

## 2023-08-04 NOTE — Telephone Encounter (Signed)
 Dr. Federico, patient will be scheduled as soon as possible.  Auth Submission: NO AUTH NEEDED Site of care: Site of care: [facility] Payer: [employer] state health plan Medication & CPT/J Code(s) submitted: Venofer (Iron Sucrose) J1756 Diagnosis Code:  Route of submission (phone, fax, portal):  Phone # Fax # Auth type: Buy/Bill PB Units/visits requested: 200mg  x 5 doses Reference number:  Approval from: 08/04/23 to 12/05/23

## 2023-08-15 ENCOUNTER — Ambulatory Visit

## 2023-08-15 VITALS — BP 106/66 | HR 63 | Temp 98.6°F | Resp 16 | Ht 68.0 in | Wt 140.6 lb

## 2023-08-15 DIAGNOSIS — D500 Iron deficiency anemia secondary to blood loss (chronic): Secondary | ICD-10-CM

## 2023-08-15 DIAGNOSIS — N920 Excessive and frequent menstruation with regular cycle: Secondary | ICD-10-CM

## 2023-08-15 MED ORDER — IRON SUCROSE 20 MG/ML IV SOLN
200.0000 mg | Freq: Once | INTRAVENOUS | Status: AC
Start: 2023-08-15 — End: 2023-08-15
  Administered 2023-08-15: 200 mg via INTRAVENOUS
  Filled 2023-08-15: qty 10

## 2023-08-15 NOTE — Patient Instructions (Signed)

## 2023-08-15 NOTE — Progress Notes (Signed)
 Diagnosis: Iron Deficiency Anemia  Provider:  Chilton Greathouse MD  Procedure: IV Push  IV Type: Peripheral, IV Location: L Antecubital  Venofer (Iron Sucrose), Dose: 200 mg  Post Infusion IV Care: Observation period completed and Peripheral IV Discontinued  Discharge: Condition: Good, Destination: Home . AVS Provided  Performed by:  Adriana Mccallum, RN

## 2023-08-17 ENCOUNTER — Ambulatory Visit

## 2023-08-17 VITALS — BP 104/67 | HR 67 | Temp 98.3°F | Resp 14 | Ht 68.0 in | Wt 138.0 lb

## 2023-08-17 DIAGNOSIS — N920 Excessive and frequent menstruation with regular cycle: Secondary | ICD-10-CM

## 2023-08-17 DIAGNOSIS — D500 Iron deficiency anemia secondary to blood loss (chronic): Secondary | ICD-10-CM | POA: Diagnosis not present

## 2023-08-17 MED ORDER — IRON SUCROSE 20 MG/ML IV SOLN
200.0000 mg | Freq: Once | INTRAVENOUS | Status: AC
Start: 2023-08-17 — End: 2023-08-17
  Administered 2023-08-17: 200 mg via INTRAVENOUS
  Filled 2023-08-17: qty 10

## 2023-08-17 NOTE — Progress Notes (Signed)
 Diagnosis: Iron  Deficiency Anemia  Provider:  Praveen Mannam MD  Procedure: IV Push  IV Type: Peripheral, IV Location: L Antecubital  Venofer  (Iron  Sucrose), Dose: 200 mg  Post Infusion IV Care: Observation period completed  Discharge: Condition: Good, Destination: Home . AVS Declined  Performed by:  Leita FORBES Miles, LPN

## 2023-08-19 ENCOUNTER — Ambulatory Visit

## 2023-08-19 VITALS — BP 95/63 | HR 74 | Temp 98.1°F | Resp 18 | Ht 68.0 in | Wt 137.2 lb

## 2023-08-19 DIAGNOSIS — N920 Excessive and frequent menstruation with regular cycle: Secondary | ICD-10-CM | POA: Diagnosis not present

## 2023-08-19 DIAGNOSIS — D500 Iron deficiency anemia secondary to blood loss (chronic): Secondary | ICD-10-CM | POA: Diagnosis not present

## 2023-08-19 MED ORDER — IRON SUCROSE 20 MG/ML IV SOLN
200.0000 mg | Freq: Once | INTRAVENOUS | Status: AC
Start: 2023-08-19 — End: 2023-08-19
  Administered 2023-08-19: 200 mg via INTRAVENOUS
  Filled 2023-08-19: qty 10

## 2023-08-19 MED ORDER — IRON SUCROSE 200 MG IVPB - SIMPLE MED
200.0000 mg | Freq: Once | Status: DC
Start: 2023-08-19 — End: 2023-08-19

## 2023-08-19 MED ORDER — SODIUM CHLORIDE 0.9 % IV SOLN
200.0000 mg | Freq: Once | INTRAVENOUS | Status: DC
  Filled 2023-08-19: qty 10

## 2023-08-19 NOTE — Progress Notes (Signed)
 Diagnosis: Iron Deficiency Anemia  Provider:  Chilton Greathouse MD  Procedure: IV Push  IV Type: Peripheral, IV Location: L Antecubital  Venofer (Iron Sucrose), Dose: 200 mg  Post Infusion IV Care: Observation period completed and Peripheral IV Discontinued  Discharge: Condition: Good, Destination: Home . AVS Declined  Performed by:  Rico Ala, LPN

## 2023-08-22 ENCOUNTER — Ambulatory Visit

## 2023-08-22 VITALS — BP 99/67 | HR 68 | Temp 98.7°F | Resp 18 | Ht 68.0 in | Wt 138.4 lb

## 2023-08-22 DIAGNOSIS — N920 Excessive and frequent menstruation with regular cycle: Secondary | ICD-10-CM | POA: Diagnosis not present

## 2023-08-22 DIAGNOSIS — D500 Iron deficiency anemia secondary to blood loss (chronic): Secondary | ICD-10-CM

## 2023-08-22 MED ORDER — SODIUM CHLORIDE 0.9 % IV BOLUS
250.0000 mL | Freq: Once | INTRAVENOUS | Status: DC
  Filled 2023-08-22: qty 250

## 2023-08-22 MED ORDER — IRON SUCROSE 20 MG/ML IV SOLN: 200.0000 mg | Freq: Once | INTRAVENOUS | Status: DC

## 2023-08-22 MED ORDER — SODIUM CHLORIDE 0.9 % IV SOLN
200.0000 mg | Freq: Once | INTRAVENOUS | Status: AC
  Administered 2023-08-22 (×2): 200 mg via INTRAVENOUS
  Filled 2023-08-22: qty 10

## 2023-08-22 NOTE — Progress Notes (Signed)
 Diagnosis: Acute Anemia  Provider:  Lonna Coder MD  Procedure: IV Infusion  IV Type: Peripheral, IV Location: R Antecubital  Venofer  (Iron  Sucrose), Dose: 200 mg  Infusion Start Time: 1325  Infusion Stop Time: 1343  Post Infusion IV Care: Observation period completed and Peripheral IV Discontinued  Discharge: Condition: Good, Destination: Home . AVS Declined  Performed by:  Donny Childes, RN

## 2023-08-24 ENCOUNTER — Ambulatory Visit: Payer: Self-pay | Admitting: Hematology and Oncology

## 2023-08-24 ENCOUNTER — Ambulatory Visit

## 2023-08-24 ENCOUNTER — Telehealth: Payer: Self-pay

## 2023-08-24 ENCOUNTER — Ambulatory Visit (INDEPENDENT_AMBULATORY_CARE_PROVIDER_SITE_OTHER)

## 2023-08-24 VITALS — BP 102/68 | HR 77 | Temp 98.7°F | Resp 18 | Ht 68.0 in | Wt 138.2 lb

## 2023-08-24 DIAGNOSIS — N920 Excessive and frequent menstruation with regular cycle: Secondary | ICD-10-CM

## 2023-08-24 DIAGNOSIS — D500 Iron deficiency anemia secondary to blood loss (chronic): Secondary | ICD-10-CM

## 2023-08-24 MED ORDER — SODIUM CHLORIDE 0.9 % IV BOLUS
250.0000 mL | Freq: Once | INTRAVENOUS | Status: DC
  Filled 2023-08-24: qty 250

## 2023-08-24 MED ORDER — SODIUM CHLORIDE 0.9 % IV SOLN
200.0000 mg | Freq: Once | INTRAVENOUS | Status: AC
Start: 2023-08-24 — End: 2023-08-24
  Administered 2023-08-24 (×2): 200 mg via INTRAVENOUS
  Filled 2023-08-24: qty 10

## 2023-08-24 NOTE — Progress Notes (Signed)
 Diagnosis: Iron  Deficiency Anemia  Provider:  Mannam, Praveen MD  Procedure: IV Infusion  IV Type: Peripheral, IV Location: L Antecubital  Venofer  (Iron  Sucrose), Dose: 200 mg  Infusion Start Time: 1041  Infusion Stop Time: 1056  Post Infusion IV Care: Patient declined observation and Peripheral IV Discontinued  Discharge: Condition: Stable, Destination: Home . AVS Declined  Performed by:  Rocky FORBES Sar, RN

## 2023-10-26 ENCOUNTER — Inpatient Hospital Stay: Attending: Hematology and Oncology

## 2023-10-26 ENCOUNTER — Other Ambulatory Visit: Payer: Self-pay | Admitting: Hematology and Oncology

## 2023-10-26 DIAGNOSIS — D500 Iron deficiency anemia secondary to blood loss (chronic): Secondary | ICD-10-CM

## 2023-11-03 ENCOUNTER — Inpatient Hospital Stay: Admitting: Hematology and Oncology

## 2023-11-03 ENCOUNTER — Telehealth: Payer: Self-pay | Admitting: Hematology and Oncology

## 2023-11-03 ENCOUNTER — Inpatient Hospital Stay

## 2023-11-03 NOTE — Telephone Encounter (Signed)
 Patient called back to cancel appointments. Said she has to work so she cannot come, and she will call back to reschedule.

## 2023-11-03 NOTE — Progress Notes (Deleted)
 [facility] [phone]   [phone]) [phone]  PROGRESS NOTE  Patient Care Team: Patient, No Pcp Per as PCP - General (General Practice)  Hematological/Oncological History # ***  Interval History:  Priscilla Fox 43 y.o. female with medical history significant for *** presents for a follow up visit. The patient's last visit was on ***. In the interim since the last visit ***  MEDICAL HISTORY:  Past Medical History:  Diagnosis Date   Anemia    Iron  supplements taken in the past   Asthma    Infection    Yeast;not frequent   Infection    BV;not frequent    SURGICAL HISTORY: Past Surgical History:  Procedure Laterality Date   ETHMOIDECTOMY Bilateral 10/06/2018   Procedure: TOTAL ETHMOIDECTOMY WITH TISSUE REMOVAL;  Surgeon: Karis Clunes, MD;  Location: [facility];  Service: ENT;  Laterality: Bilateral;   FRONTAL SINUS EXPLORATION Bilateral 10/06/2018   Procedure: ENDOSCOPIC FRONTAL RECESS EXPLORATION;  Surgeon: Karis Clunes, MD;  Location: [facility];  Service: ENT;  Laterality: Bilateral;   MAXILLARY ANTROSTOMY Bilateral 10/06/2018   Procedure: ENDOSCOPIC MAXILLARY ANTROSTOMY WITH TISSUE REMOVAL;  Surgeon: Karis Clunes, MD;  Location: [facility];  Service: ENT;  Laterality: Bilateral;   SINUS ENDO WITH FUSION Bilateral 10/06/2018   Procedure: SINUS ENDO WITH FUSION;  Surgeon: Karis Clunes, MD;  Location: [facility];  Service: ENT;  Laterality: Bilateral;   SPHENOIDECTOMY Bilateral 10/06/2018   Procedure: TOTAL SPHENOIDECTOMY WITH TISSUE REMOVAL;  Surgeon: Karis Clunes, MD;  Location: [facility];  Service: ENT;  Laterality: Bilateral;   TURBINATE REDUCTION Bilateral 10/06/2018   Procedure: PARTIAL INFERIOR TURBINATE REDUCTION;  Surgeon: Karis Clunes, MD;  Location:  [facility]ERY [facility];  Service: ENT;  Laterality: Bilateral;   TURBINATE RESECTION     WISDOM TOOTH EXTRACTION  2003   All 4 removed     SOCIAL HISTORY: Social History   Socioeconomic History   Marital status: Married    Spouse name: Theatre manager   Number of children: 2   Years of education: 16   Highest education level: Not on file  Occupational History   Occupation: Production designer, theatre/television/film  Tobacco Use   Smoking status: Former    Current packs/day: 0.00    Average packs/day: 0.3 packs/day for 10.0 years (3.3 ttl pk-yrs)    Types: Cigarettes    Start date: 11/21/2001    Quit date: 11/22/2011    Years since quitting: 11.9   Smokeless tobacco: Never   Tobacco comments:    on and off social smoker  Substance and Sexual Activity   Alcohol use: Not Currently   Drug use: No   Sexual activity: Yes  Other Topics Concern   Not on file  Social History Narrative   Not on file   Social Drivers of Health   Financial Resource Strain: Not on file  Food Insecurity: No Food Insecurity (08/03/2023)   Hunger Vital Sign    Worried About Running Out of Food in the Last Year: Never true    Ran Out of Food in the Last Year: Never true  Transportation Needs: No Transportation Needs (08/03/2023)   PRAPARE - Transportation    Lack of Transportation (Medical): No    Lack of Transportation (Non-Medical): No  Physical Activity: Not on file  Stress: Not on file  Social Connections: Not on file  Intimate Partner Violence: Not At Risk (08/03/2023)   Humiliation, Afraid, Rape, and Kick questionnaire  Fear of Current or Ex-Partner: No    Emotionally Abused: No    Physically Abused: No    Sexually Abused: No    FAMILY HISTORY: Family History  Problem Relation Age of Onset   Hypothyroidism Mother    Hypertension Maternal Grandmother    Stroke Maternal Grandmother        d/t alcoholism   Diabetes Paternal Grandmother     ALLERGIES:  has no known allergies.  MEDICATIONS:  Current Outpatient Medications  Medication Sig Dispense Refill   albuterol (VENTOLIN HFA) 108 (90 Base) MCG/ACT inhaler Inhale 2 puffs into the lungs every 4  (four) hours as needed for wheezing.     budesonide-formoterol (SYMBICORT) 160-4.5 MCG/ACT inhaler Inhale 2 puffs into the lungs 2 (two) times daily.     Iron -Vitamin C 100-250 MG TABS Take 1 tablet by mouth every morning.     No current facility-administered medications for this visit.    REVIEW OF SYSTEMS:   Constitutional: ( - ) fevers, ( - )  chills , ( - ) night sweats Eyes: ( - ) blurriness of vision, ( - ) double vision, ( - ) watery eyes Ears, nose, mouth, throat, and face: ( - ) mucositis, ( - ) sore throat Respiratory: ( - ) cough, ( - ) dyspnea, ( - ) wheezes Cardiovascular: ( - ) palpitation, ( - ) chest discomfort, ( - ) lower extremity swelling Gastrointestinal:  ( - ) nausea, ( - ) heartburn, ( - ) change in bowel habits Skin: ( - ) abnormal skin rashes Lymphatics: ( - ) new lymphadenopathy, ( - ) easy bruising Neurological: ( - ) numbness, ( - ) tingling, ( - ) new weaknesses Behavioral/Psych: ( - ) mood change, ( - ) new changes  All other systems were reviewed with the patient and are negative.  PHYSICAL EXAMINATION: ECOG PERFORMANCE STATUS: {CHL ONC ECOG [phone]}  There were no vitals filed for this visit. There were no vitals filed for this visit.  GENERAL: alert, no distress and comfortable SKIN: skin color, texture, turgor are normal, no rashes or significant lesions EYES: conjunctiva are pink and non-injected, sclera clear OROPHARYNX: no exudate, no erythema; lips, buccal mucosa, and tongue normal  NECK: supple, non-tender LYMPH:  no palpable lymphadenopathy in the cervical, axillary or inguinal LUNGS: clear to auscultation and percussion with normal breathing effort HEART: regular rate & rhythm and no murmurs and no lower extremity edema ABDOMEN: soft, non-tender, non-distended, normal bowel sounds Musculoskeletal: no cyanosis of digits and no clubbing  PSYCH: alert & oriented x 3, fluent speech NEURO: no focal motor/sensory deficits  LABORATORY  DATA:  I have reviewed the data as listed    Latest Ref Rng & Units 08/03/2023    9:36 AM 12/08/2011   12:09 PM 09/25/2006    5:41 AM  CBC  WBC 4.0 - 10.5 K/uL 4.4  5.9  8.5   Hemoglobin 12.0 - 15.0 g/dL 9.8  88.9  88.3   Hematocrit 36.0 - 46.0 % 32.8  34.1  33.5   Platelets 150 - 400 K/uL 314  228  149        Latest Ref Rng & Units 08/03/2023    9:36 AM  CMP  Glucose 70 - 99 mg/dL 85   BUN 6 - 20 mg/dL 9   Creatinine 9.55 - 8.99 mg/dL 9.24   Sodium 864 - 854 mmol/L 137   Potassium 3.5 - 5.1 mmol/L 4.1   Chloride 98 - 111 mmol/L  104   CO2 22 - 32 mmol/L 29   Calcium 8.9 - 10.3 mg/dL 9.3   Total Protein 6.5 - 8.1 g/dL 7.4   Total Bilirubin 0.0 - 1.2 mg/dL 0.3   Alkaline Phos 38 - 126 U/L 60   AST 15 - 41 U/L 16   ALT 0 - 44 U/L 8     No results found for: MPROTEIN No results found for: KPAFRELGTCHN, LAMBDASER, KAPLAMBRATIO   BLOOD FILM: *** Review of the peripheral blood smear showed normal appearing white cells with neutrophils that were appropriately lobated and granulated. There was no predominance of bi-lobed or hyper-segmented neutrophils appreciated. No Dohle bodies were noted. There was no left shifting, immature forms or blasts noted. Lymphocytes remain normal in size without any predominance of large granular lymphocytes. Red cells show no anisopoikilocytosis, macrocytes , microcytes or polychromasia. There were no schistocytes, target cells, echinocytes, acanthocytes, dacrocytes, or stomatocytes.There was no rouleaux formation, nucleated red cells, or intra-cellular inclusions noted. The platelets are normal in size, shape, and color without any clumping evident.  RADIOGRAPHIC STUDIES: I have personally reviewed the radiological images as listed and agreed with the findings in the report. No results found.  ASSESSMENT & PLAN ***  No orders of the defined types were placed in this encounter.   All questions were answered. The patient knows to call the  clinic with any problems, questions or concerns.  A total of more than {CHL ONC TIME VISIT - [id number]} were spent on this encounter with face-to-face time and non-face-to-face time, including preparing to see the patient, ordering tests and/or medications, counseling the patient and coordination of care as outlined above.   Norleen IVAR Kidney, MD Department of Hematology/Oncology [facility] Phone: [phone] Pager: [phone] Email: [email or web address]  11/03/2023 7:45 AM

## 2023-11-14 ENCOUNTER — Telehealth: Payer: Self-pay | Admitting: Hematology and Oncology

## 2023-11-15 NOTE — Progress Notes (Unsigned)
 [facility] [phone]   [phone]  PROGRESS NOTE  Patient Care Team: Patient, No Pcp Per as PCP - General (General Practice)  Hematological/Oncological History # Iron  Deficiency Anemia 2/2 to GYN Bleeding  07/12/2023: WBC 4.9, Hgb 8.7, MCV 82.3, Plt 289. Ferritin 3, Iron  sat 3%.  08/03/2023: establish care with Dr. Federico  8/4-8/13/2025: IV venofer  200 mg X 5 doses.   Interval History:  Priscilla Fox 43 y.o. female with medical history significant for IDA who presents for a follow up visit. The patient's last visit was on 08/03/2023. In the interim since the last visit she received IV iron  5 doses of venofer  from 8/4- 08/24/2023.   On exam today Priscilla Fox reports she has been well overall in interim since her last visit.  She received her Venofer  via push and not IV and reports it was painful and uncomfortable.  She reports has not noticed much of a difference in her energy.  She reports energy today is about a 4 out of 10.  She reports she feels exhausted and she did not sleep very well.  She has a a lot going on in her life.  She is not having any lightheadedness, dizziness, shortness of breath.  Other than her menstrual cycle she is not having any bleeding.  She reports she had an IUD in place but is still having some menstrual bleeding.  She reports that she has been doing her best to try to eat iron  rich foods including almonds, leafy greens, and red meat.  She is taking an iron  supplement that contains vitamin C.  Otherwise she denies any fevers, chills, sweats, runny nose, sore throat, cough.  Full 10 point ROS otherwise negative.  MEDICAL HISTORY:  Past Medical History:  Diagnosis Date   Anemia    Iron  supplements taken in the past   Asthma    Infection    Yeast;not frequent   Infection    BV;not frequent    SURGICAL HISTORY: Past Surgical History:  Procedure Laterality Date   ETHMOIDECTOMY Bilateral 10/06/2018   Procedure: TOTAL  ETHMOIDECTOMY WITH TISSUE REMOVAL;  Surgeon: Karis Clunes, MD;  Location: [facility];  Service: ENT;  Laterality: Bilateral;   FRONTAL SINUS EXPLORATION Bilateral 10/06/2018   Procedure: ENDOSCOPIC FRONTAL RECESS EXPLORATION;  Surgeon: Karis Clunes, MD;  Location: [facility];  Service: ENT;  Laterality: Bilateral;   MAXILLARY ANTROSTOMY Bilateral 10/06/2018   Procedure: ENDOSCOPIC MAXILLARY ANTROSTOMY WITH TISSUE REMOVAL;  Surgeon: Karis Clunes, MD;  Location: [facility];  Service: ENT;  Laterality: Bilateral;   SINUS ENDO WITH FUSION Bilateral 10/06/2018   Procedure: SINUS ENDO WITH FUSION;  Surgeon: Karis Clunes, MD;  Location: [facility];  Service: ENT;  Laterality: Bilateral;   SPHENOIDECTOMY Bilateral 10/06/2018   Procedure: TOTAL SPHENOIDECTOMY WITH TISSUE REMOVAL;  Surgeon: Karis Clunes, MD;  Location: [facility];  Service: ENT;  Laterality: Bilateral;   TURBINATE REDUCTION Bilateral 10/06/2018   Procedure: PARTIAL INFERIOR TURBINATE REDUCTION;  Surgeon: Karis Clunes, MD;  Location: [facility];  Service: ENT;  Laterality: Bilateral;   TURBINATE RESECTION     WISDOM TOOTH EXTRACTION  2003   All 4 removed    SOCIAL HISTORY: Social History   Socioeconomic History   Marital status: Married    Spouse name: Kenesha Moshier   Number of children: 2   Years of education: 16   Highest education level: Not on file  Occupational History  Occupation: Production Designer, Theatre/television/film  Tobacco Use   Smoking status: Former    Current packs/day: 0.00    Average packs/day: 0.3 packs/day for 10.0 years (3.3 ttl pk-yrs)    Types: Cigarettes    Start date: 11/21/2001    Quit date: 11/22/2011    Years since quitting: 11.9   Smokeless tobacco: Never   Tobacco comments:    on and off social smoker  Substance and Sexual Activity   Alcohol use: Not Currently   Drug use: No   Sexual activity: Yes  Other Topics Concern   Not on file  Social History Narrative    Not on file   Social Drivers of Health   Financial Resource Strain: Not on file  Food Insecurity: No Food Insecurity (08/03/2023)   Hunger Vital Sign    Worried About Running Out of Food in the Last Year: Never true    Ran Out of Food in the Last Year: Never true  Transportation Needs: No Transportation Needs (08/03/2023)   PRAPARE - Administrator, Civil Service (Medical): No    Lack of Transportation (Non-Medical): No  Physical Activity: Not on file  Stress: Not on file  Social Connections: Not on file  Intimate Partner Violence: Not At Risk (08/03/2023)   Humiliation, Afraid, Rape, and Kick questionnaire    Fear of Current or Ex-Partner: No    Emotionally Abused: No    Physically Abused: No    Sexually Abused: No    FAMILY HISTORY: Family History  Problem Relation Age of Onset   Hypothyroidism Mother    Hypertension Maternal Grandmother    Stroke Maternal Grandmother        d/t alcoholism   Diabetes Paternal Grandmother     ALLERGIES:  has no known allergies.  MEDICATIONS:  Current Outpatient Medications  Medication Sig Dispense Refill   albuterol (VENTOLIN HFA) 108 (90 Base) MCG/ACT inhaler Inhale 2 puffs into the lungs every 4 (four) hours as needed for wheezing.     budesonide-formoterol (SYMBICORT) 160-4.5 MCG/ACT inhaler Inhale 2 puffs into the lungs 2 (two) times daily.     Iron -Vitamin C 100-250 MG TABS Take 1 tablet by mouth every morning.     No current facility-administered medications for this visit.    REVIEW OF SYSTEMS:   Constitutional: ( - ) fevers, ( - )  chills , ( - ) night sweats Eyes: ( - ) blurriness of vision, ( - ) double vision, ( - ) watery eyes Ears, nose, mouth, throat, and face: ( - ) mucositis, ( - ) sore throat Respiratory: ( - ) cough, ( - ) dyspnea, ( - ) wheezes Cardiovascular: ( - ) palpitation, ( - ) chest discomfort, ( - ) lower extremity swelling Gastrointestinal:  ( - ) nausea, ( - ) heartburn, ( - ) change in  bowel habits Skin: ( - ) abnormal skin rashes Lymphatics: ( - ) new lymphadenopathy, ( - ) easy bruising Neurological: ( - ) numbness, ( - ) tingling, ( - ) new weaknesses Behavioral/Psych: ( - ) mood change, ( - ) new changes  All other systems were reviewed with the patient and are negative.  PHYSICAL EXAMINATION: Vitals:   11/16/23 1458  BP: (!) 130/96  Pulse: 88  Resp: 14  Temp: 98.4 F (36.9 C)  SpO2: 100%   Filed Weights   11/16/23 1458  Weight: 135 lb 8 oz (61.5 kg)    GENERAL: Well-appearing middle-age African-American female, alert, no distress and comfortable  SKIN: skin color, texture, turgor are normal, no rashes or significant lesions EYES: conjunctiva are pink and non-injected, sclera clear LUNGS: clear to auscultation and percussion with normal breathing effort HEART: regular rate & rhythm and no murmurs and no lower extremity edema Musculoskeletal: no cyanosis of digits and no clubbing  PSYCH: alert & oriented x 3, fluent speech NEURO: no focal motor/sensory deficits  LABORATORY DATA:  I have reviewed the data as listed    Latest Ref Rng & Units 11/16/2023    2:51 PM 08/03/2023    9:36 AM 12/08/2011   12:09 PM  CBC  WBC 4.0 - 10.5 K/uL 5.0  4.4  5.9   Hemoglobin 12.0 - 15.0 g/dL 86.9  9.8  88.9   Hematocrit 36.0 - 46.0 % 39.9  32.8  34.1   Platelets 150 - 400 K/uL 310  314  228        Latest Ref Rng & Units 11/16/2023    2:51 PM 08/03/2023    9:36 AM  CMP  Glucose 70 - 99 mg/dL 60  85   BUN 6 - 20 mg/dL 6  9   Creatinine 9.55 - 1.00 mg/dL 9.22  9.24   Sodium 864 - 145 mmol/L 137  137   Potassium 3.5 - 5.1 mmol/L 3.0  4.1   Chloride 98 - 111 mmol/L 101  104   CO2 22 - 32 mmol/L 29  29   Calcium 8.9 - 10.3 mg/dL 9.1  9.3   Total Protein 6.5 - 8.1 g/dL 7.7  7.4   Total Bilirubin 0.0 - 1.2 mg/dL 0.5  0.3   Alkaline Phos 38 - 126 U/L 55  60   AST 15 - 41 U/L 18  16   ALT 0 - 44 U/L 9  8     RADIOGRAPHIC STUDIES: No results found.  ASSESSMENT &  PLAN Priscilla Fox 43 y.o. female with medical history significant for IDA who presents for a follow up visit.  After review of the labs, review of the records, and discussion with the patient the patients findings are most consistent with iron  deficiency anemia secondary to GYN bleeding.   # Iron  Deficiency Anemia 2/2 to GYN Bleeding -- Findings are consistent with iron  deficiency anemia secondary to patient's menorrhagia --Encouraged her to follow-up with OB/GYN for better control of her menstrual cycles --Continue PO iron  pills daily with a source of vitamin C --We will plan to proceed with IV iron  therapy in order to help bolster the patient's blood counts if iron  levels are persistently low --Labs today show white blood cell 5.0, hemoglobin 13.0, MCV 91.9, platelets 310. --Plan for return to clinic in 6 months to reevaluate  No orders of the defined types were placed in this encounter.   All questions were answered. The patient knows to call the clinic with any problems, questions or concerns.  A total of more than 30 minutes were spent on this encounter with face-to-face time and non-face-to-face time, including preparing to see the patient, ordering tests and/or medications, counseling the patient and coordination of care as outlined above.   Norleen IVAR Kidney, MD Department of Hematology/Oncology [facility] at [facility] Phone: [phone] Pager: [phone] Email: [email or web address]  11/16/2023 4:02 PM

## 2023-11-16 ENCOUNTER — Inpatient Hospital Stay: Admitting: Hematology and Oncology

## 2023-11-16 ENCOUNTER — Inpatient Hospital Stay: Attending: Hematology and Oncology

## 2023-11-16 VITALS — BP 130/96 | HR 88 | Temp 98.4°F | Resp 14 | Wt 135.5 lb

## 2023-11-16 DIAGNOSIS — Z87891 Personal history of nicotine dependence: Secondary | ICD-10-CM | POA: Diagnosis not present

## 2023-11-16 DIAGNOSIS — D500 Iron deficiency anemia secondary to blood loss (chronic): Secondary | ICD-10-CM | POA: Insufficient documentation

## 2023-11-16 DIAGNOSIS — N920 Excessive and frequent menstruation with regular cycle: Secondary | ICD-10-CM | POA: Diagnosis not present

## 2023-11-16 LAB — CBC WITH DIFFERENTIAL (CANCER CENTER ONLY)
Abs Immature Granulocytes: 0 K/uL (ref 0.00–0.07)
Basophils Absolute: 0.1 K/uL (ref 0.0–0.1)
Basophils Relative: 1 %
Eosinophils Absolute: 0.5 K/uL (ref 0.0–0.5)
Eosinophils Relative: 9 %
HCT: 39.9 % (ref 36.0–46.0)
Hemoglobin: 13 g/dL (ref 12.0–15.0)
Immature Granulocytes: 0 %
Lymphocytes Relative: 44 %
Lymphs Abs: 2.2 K/uL (ref 0.7–4.0)
MCH: 30 pg (ref 26.0–34.0)
MCHC: 32.6 g/dL (ref 30.0–36.0)
MCV: 91.9 fL (ref 80.0–100.0)
Monocytes Absolute: 0.3 K/uL (ref 0.1–1.0)
Monocytes Relative: 6 %
Neutro Abs: 2 K/uL (ref 1.7–7.7)
Neutrophils Relative %: 40 %
Platelet Count: 310 K/uL (ref 150–400)
RBC: 4.34 MIL/uL (ref 3.87–5.11)
RDW: 14.6 % (ref 11.5–15.5)
WBC Count: 5 K/uL (ref 4.0–10.5)
nRBC: 0 % (ref 0.0–0.2)

## 2023-11-16 LAB — CMP (CANCER CENTER ONLY)
ALT: 9 U/L (ref 0–44)
AST: 18 U/L (ref 15–41)
Albumin: 4.6 g/dL (ref 3.5–5.0)
Alkaline Phosphatase: 55 U/L (ref 38–126)
Anion gap: 7 (ref 5–15)
BUN: 6 mg/dL (ref 6–20)
CO2: 29 mmol/L (ref 22–32)
Calcium: 9.1 mg/dL (ref 8.9–10.3)
Chloride: 101 mmol/L (ref 98–111)
Creatinine: 0.77 mg/dL (ref 0.44–1.00)
GFR, Estimated: >60 mL/min (ref >60)
Glucose, Bld: 60 mg/dL — ABNORMAL LOW (ref 70–99)
Potassium: 3 mmol/L — ABNORMAL LOW (ref 3.5–5.1)
Sodium: 137 mmol/L (ref 135–145)
Total Bilirubin: 0.5 mg/dL (ref 0.0–1.2)
Total Protein: 7.7 g/dL (ref 6.5–8.1)

## 2023-11-16 LAB — RETIC PANEL
Immature Retic Fract: 12.5 % (ref 2.3–15.9)
RBC.: 4.33 MIL/uL (ref 3.87–5.11)
Retic Count, Absolute: 63.2 K/uL (ref 19.0–186.0)
Retic Ct Pct: 1.5 % (ref 0.4–3.1)
Reticulocyte Hemoglobin: 36.9 pg (ref >27.9)

## 2023-11-16 LAB — FERRITIN: Ferritin: 94 ng/mL (ref 11–307)

## 2023-11-16 LAB — IRON AND IRON BINDING CAPACITY (CC-WL,HP ONLY)
Iron: 107 ug/dL (ref 28–170)
Saturation Ratios: 27 % (ref 10.4–31.8)
TIBC: 396 ug/dL (ref 250–450)
UIBC: 289 ug/dL (ref 148–442)

## 2024-05-14 ENCOUNTER — Inpatient Hospital Stay

## 2024-05-14 ENCOUNTER — Inpatient Hospital Stay: Admitting: Hematology and Oncology
